# Patient Record
Sex: Male | Born: 1975 | Race: White | Hispanic: No | Marital: Single | Smoking: Current every day smoker
Health system: Southern US, Community
[De-identification: ages and names within clinical notes are randomized; demographics above are authoritative.]

## PROBLEM LIST (undated history)

## (undated) DIAGNOSIS — E119 Type 2 diabetes mellitus without complications: Secondary | ICD-10-CM

## (undated) DIAGNOSIS — J45909 Unspecified asthma, uncomplicated: Secondary | ICD-10-CM

## (undated) HISTORY — PX: KNEE SURGERY: SHX244

## (undated) HISTORY — DX: Unspecified asthma, uncomplicated: J45.909

## (undated) HISTORY — DX: Type 2 diabetes mellitus without complications: E11.9

---

## 1998-01-12 ENCOUNTER — Ambulatory Visit (HOSPITAL_COMMUNITY): Admission: RE | Admit: 1998-01-12 | Discharge: 1998-01-12 | Payer: Self-pay | Admitting: Internal Medicine

## 2010-06-05 ENCOUNTER — Ambulatory Visit (HOSPITAL_COMMUNITY): Admission: RE | Admit: 2010-06-05 | Discharge: 2010-06-05 | Payer: Self-pay | Admitting: Family Medicine

## 2017-01-06 ENCOUNTER — Ambulatory Visit (INDEPENDENT_AMBULATORY_CARE_PROVIDER_SITE_OTHER): Payer: 59 | Admitting: Internal Medicine

## 2017-01-06 ENCOUNTER — Encounter: Payer: Self-pay | Admitting: Internal Medicine

## 2017-01-06 VITALS — BP 122/80 | HR 103 | Ht 67.0 in | Wt 128.0 lb

## 2017-01-06 DIAGNOSIS — R05 Cough: Secondary | ICD-10-CM

## 2017-01-06 DIAGNOSIS — F17210 Nicotine dependence, cigarettes, uncomplicated: Secondary | ICD-10-CM

## 2017-01-06 DIAGNOSIS — R058 Other specified cough: Secondary | ICD-10-CM

## 2017-01-06 MED ORDER — FAMOTIDINE 20 MG PO TABS: ORAL_TABLET | ORAL | 2 refills | Status: DC

## 2017-01-06 MED ORDER — PREDNISONE 10 MG PO TABS: ORAL_TABLET | ORAL | 0 refills | Status: DC

## 2017-01-06 MED ORDER — TRAMADOL HCL 50 MG PO TABS: ORAL_TABLET | ORAL | 0 refills | Status: DC

## 2017-01-06 MED ORDER — PANTOPRAZOLE SODIUM 40 MG PO TBEC: 40.0000 mg | DELAYED_RELEASE_TABLET | Freq: Every day | ORAL | 2 refills | Status: DC

## 2017-01-06 NOTE — Assessment & Plan Note (Addendum)
Spirometry 01/06/2017  No obstruction/ no curvature on f/v loop   > 3 min discussion I reviewed the Fletcher curve with the patient that basically indicates  if you quit smoking when your best day FEV1 is still well preserved (as is clearly  the case here)  it is highly unlikely you will progress to severe disease and informed the patient there was  no medication on the market that has proven to alter the curve/ its downward trajectory  or the likelihood of progression of their disease(unlike other chronic medical conditions such as atheroclerosis where we do think we can change the natural hx with risk reducing meds)    Therefore stopping smoking and maintaining abstinence is the most important aspect of care, not choice of inhalers or for that matter, doctors.

## 2017-01-06 NOTE — Assessment & Plan Note (Addendum)
Spirometry 01/06/2017  No obstruction/ no curvature on f/v loop  Sinus CT 01/06/2017 >>>   The most common causes of chronic cough in immunocompetent adults include the following: upper airway cough syndrome (UACS), previously referred to as postnasal drip syndrome (PNDS), which is caused by variety of rhinosinus conditions; (2) asthma; (3) GERD; (4) chronic bronchitis from cigarette smoking or other inhaled environmental irritants; (5) nonasthmatic eosinophilic bronchitis; and (6) bronchiectasis.   These conditions, singly or in combination, have accounted for up to 94% of the causes of chronic cough in prospective studies.   Other conditions have constituted no >6% of the causes in prospective studies These have included bronchogenic carcinoma, chronic interstitial pneumonia, sarcoidosis, left ventricular failure, ACEI-induced cough, and aspiration from a condition associated with pharyngeal dysfunction.    Chronic cough is often simultaneously caused by more than one condition. A single cause has been found from 38 to 82% of the time, multiple causes from 18 to 62%. Multiply caused cough has been the result of three diseases up to 42% of the time and I strongly suspect this is the case here.  However predominant symptom of "something stuck in the throat" are typical of Upper airway cough syndrome (previously labeled PNDS) , is  so named because it's frequently impossible to sort out how much is  CR/sinusitis with freq throat clearing (which can be related to primary GERD)   vs  causing  secondary (" extra esophageal")  GERD from wide swings in gastric pressure that occur with throat clearing, often  promoting self use of mint and menthol lozenges that reduce the lower esophageal sphincter tone and exacerbate the problem further in a cyclical fashion.   These are the same pts (now being labeled as having "irritable larynx syndrome" by some cough centers) who not infrequently have a history of having  failed to tolerate ace inhibitors,  dry powder inhalers or biphosphonates or report having atypical/extraesophageal reflux symptoms that don't respond to standard doses of PPI  and are easily confused as having aecopd or asthma flares by even experienced allergists/ pulmonologists (myself included).   Of the three most common causes of  Sub-acute or recurrent or chronic cough, only one (GERD)  can actually contribute to/ trigger  the other two (asthma and post nasal drip syndrome)  and perpetuate the cylce of cough.  While not intuitively obvious, many patients with chronic low grade reflux do not cough until there is a primary insult that disturbs the protective epithelial barrier and exposes sensitive nerve endings.   This is typically viral (which was likely the case here) but can be direct physical injury such as with an endotracheal tube.   The point is that once this occurs, it is difficult to eliminate the cycle  using anything but a maximally effective acid suppression regimen at least in the short run, accompanied by an appropriate diet to address non acid GERD and control / eliminate the cough itself for at least 3 days.    rec max rx for gerd/ cyclical cough and upper airways inflammation with 24 acid suppression/ tramadol to eliminate throat clearing and Prednisone 10 mg take  4 each am x 2 days,   2 each am x 2 days,  1 each am x 2 days and stop then return prn   Total time devoted to counseling  > 50 % of initial 60 min office visit:  review case with pt/ discussion of options/alternatives/ personally creating written customized instructions  in presence of  pt  then going over those specific  Instructions directly with the pt including how to use all of the meds but in particular covering each new medication in detail and the difference between the maintenance= "automatic" meds and the prns using an action plan format for the latter (If this problem/symptom => do that organization reading  Left to right).  Please see AVS from this visit for a full list of these instructions which I personally wrote for this pt and  are unique to this visit.

## 2017-01-06 NOTE — Progress Notes (Signed)
Subjective:     Patient ID: Adam Potter, male   DOB: 02-18-1976, 41 y.o.   MRN: [id number]  HPI  [age] yowm active smoker referred to pulmonary clinic 01/06/2017 by Jarrett Sohoourtney Wharton Pa with new onset cough x late Nov 2017 minimally improved on  ventolin / asthmanex with sensation of "something stuck" in his throat.   01/06/2017 1st [facility] office visit/ Wert   Chief Complaint  Patient presents with  . Pulmonary Consult    Referred by Jarrett Sohoourtney Wharton, PA.  Pt c/o SOB, cough and the feeling of congestion in his throat since Thanksgiving 2017.  His cough is occ prod with white sputum.  He states he notices the SOB late in the evening and first thing in the am.    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  Not limited by breathing from desired activities    No obvious day to day or daytime variability or assoc excess/ purulent sputum or mucus plugs or hemoptysis or cp or chest tightness, subjective wheeze or overt   hb symptoms. No unusual exp hx or h/o childhood pna/ asthma or knowledge of premature birth.  Sleeping ok without nocturnal  or early am exacerbation  of respiratory  c/o's or need for noct saba. Also denies any obvious fluctuation of symptoms with weather or environmental changes or other aggravating or alleviating factors except as outlined above   Current Medications, Allergies, Complete Past Medical History, Past Surgical History, Family History, and Social History were reviewed in [facility] electronic medical record.  ROS  The following are not active complaints unless bolded sore throat, dysphagia, dental problems, itching, sneezing,   nasal congestion or excess/ purulent secretions, ear ache,   fever, chills, sweats, unintended wt loss, classically pleuritic or exertional cp,  orthopnea pnd or leg swelling, presyncope, palpitations, abdominal pain, anorexia, nausea, vomiting, diarrhea  or change in bowel or bladder habits, change in stools or urine, dysuria,hematuria,  rash, arthralgias, visual complaints, headache, numbness, weakness or ataxia or problems with walking or coordination,  change in mood/affect or memory.         Review of Systems     Objective:   Physical Exam    amb wm nad    Wt Readings from Last 3 Encounters:  01/06/17 128 lb (58.1 kg)    Vital signs reviewed - Note on arrival 02 sats  98% on RA      HEENT: nl dentition, turbinates bilaterally, and oropharynx. Nl external ear canals without cough reflex   NECK :  without JVD/Nodes/TM/ nl carotid upstrokes bilaterally   LUNGS: no acc muscle use,  Nl contour chest which is clear to A and P bilaterally without cough on insp or exp maneuvers   CV:  RRR  no s3 or murmur or increase in P2, and no edema   ABD:  soft and nontender with nl inspiratory excursion in the supine position. No bruits or organomegaly appreciated, bowel sounds nl  MS:  Nl gait/ ext warm without deformities, calf tenderness, cyanosis or clubbing No obvious joint restrictions   SKIN: warm and dry without lesions    NEURO:  alert, approp, nl sensorium with  no motor or cerebellar deficits apparent.     I personally reviewed images and agree with radiology impression as follows:  CXR:   10/02/16  wnl    Assessment:

## 2017-01-06 NOTE — Patient Instructions (Signed)
The key is to stop smoking completely before smoking completely stops you - it's clearly not too late  Prednisone 10 mg take  4 each am x 2 days,   2 each am x 2 days,  1 each am x 2 days and stop   Pantoprazole (protonix) 40 mg   Take  30-60 min before first meal of the day and Pepcid (famotidine)  20 mg one @  bedtime until return to office - this is the best way to tell whether stomach acid is contributing to your problem.     GERD (REFLUX)  is an extremely common cause of respiratory symptoms just like yours , many times with no obvious heartburn at all.    It can be treated with medication, but also with lifestyle changes including elevation of the head of your bed (ideally with 6 inch  bed blocks),  Smoking cessation, avoidance of late meals, excessive alcohol, and avoid fatty foods, chocolate, peppermint, colas, red wine, and acidic juices such as orange juice.  NO MINT OR MENTHOL PRODUCTS SO NO COUGH DROPS   USE SUGARLESS CANDY INSTEAD (Jolley ranchers or Stover's or Life Savers) or even ice chips will also do - the key is to swallow to prevent all throat clearing. NO OIL BASED VITAMINS - use powdered substitutes.   Please see patient coordinator before you leave today  to schedule Sinus CT   If not better return here in 2 weeks

## 2017-01-08 ENCOUNTER — Ambulatory Visit (INDEPENDENT_AMBULATORY_CARE_PROVIDER_SITE_OTHER)
Admission: RE | Admit: 2017-01-08 | Discharge: 2017-01-08 | Disposition: A | Discharge status: Home / Self Care | Payer: 59 | Source: Ambulatory Visit | Attending: Internal Medicine | Admitting: Internal Medicine

## 2017-01-08 DIAGNOSIS — R05 Cough: Secondary | ICD-10-CM

## 2017-01-08 DIAGNOSIS — R058 Other specified cough: Secondary | ICD-10-CM

## 2017-01-08 NOTE — Progress Notes (Signed)
Spoke with pt and notified of results per Dr. Wert. Pt verbalized understanding and denied any questions. 

## 2017-01-30 ENCOUNTER — Encounter: Payer: Self-pay | Admitting: Internal Medicine

## 2017-01-30 ENCOUNTER — Ambulatory Visit (INDEPENDENT_AMBULATORY_CARE_PROVIDER_SITE_OTHER): Payer: 59 | Admitting: Internal Medicine

## 2017-01-30 VITALS — BP 126/80 | HR 95 | Ht 67.0 in | Wt 129.0 lb

## 2017-01-30 DIAGNOSIS — R05 Cough: Secondary | ICD-10-CM | POA: Diagnosis not present

## 2017-01-30 DIAGNOSIS — R058 Other specified cough: Secondary | ICD-10-CM

## 2017-01-30 DIAGNOSIS — F17210 Nicotine dependence, cigarettes, uncomplicated: Secondary | ICD-10-CM

## 2017-01-30 NOTE — Progress Notes (Signed)
Subjective:     Patient ID: Adam Potter, male   DOB: 26-May-1976,    MRN: [id number]   Brief patient profile:  85 yowm active smoker referred to pulmonary clinic 01/06/2017 by Jarrett Soho Pa with new onset cough x late Nov 2017 minimally improved on  ventolin / asthmanex with sensation of "something stuck" in his throat    History of Present Illness  01/06/2017 1st [facility] office visit/ Maripat Borba   Chief Complaint  Patient presents with  . Pulmonary Consult    Referred by Jarrett Soho, PA.  Pt c/o SOB, cough and the feeling of congestion in his throat since Thanksgiving 2017.  His cough is occ prod with white sputum.  He states he notices the SOB late in the evening and first thing in the am.    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 rec The key is to stop smoking completely before smoking completely stops you - it's clearly not too late Prednisone 10 mg take  4 each am x 2 days,   2 each am x 2 days,  1 each am x 2 days and stop  Pantoprazole (protonix) 40 mg   Take  30-60 min before first meal of the day and Pepcid (famotidine)  20 mg one @  bedtime until return to office - this is the best way to tell whether stomach acid is contributing to your problem.   GERD (REFLUX)    01/30/2017  f/u ov/Luellen Howson re:  uacs  Improved on gerd rx / still smoking / no resp rx  Chief Complaint  Patient presents with  . Follow-up    Breathing and cough have improved some but not back to normal baseline. No new co's.    has not used any saba/ really Not limited by breathing from desired activities  But not aerobically active  No obvious day to day or daytime  variability or assoc excess/ purulent sputum or mucus plugs or hemoptysis or cp or chest tightness, subjective wheeze or overt sinus or hb symptoms. No unusual exp hx or h/o childhood pna/ asthma or knowledge of premature birth.  Sleeping ok without nocturnal  or early am exacerbation  of respiratory  c/o's or need for noct saba. Also denies any obvious fluctuation of symptoms with weather or environmental changes or other aggravating or alleviating factors except as outlined above   Current Medications, Allergies, Complete Past Medical History, Past Surgical History, Family History, and Social History were reviewed in [facility] record.  ROS  The following are not active complaints unless bolded sore throat, dysphagia, dental problems, itching, sneezing,  nasal congestion or excess/ purulent secretions, ear ache,   fever, chills, sweats, unintended wt loss, classically pleuritic or exertional cp,  orthopnea pnd or leg swelling, presyncope, palpitations, abdominal pain, anorexia, nausea, vomiting, diarrhea  or change in bowel or bladder habits, change in stools or urine, dysuria,hematuria,  rash, arthralgias, visual complaints, headache, numbness, weakness or ataxia or problems with walking or coordination,  change in mood/affect or memory.  Objective:   Physical Exam    amb wm nad     01/30/2017       129   01/06/17 128 lb (58.1 kg)    Vital signs reviewed - Note on arrival 02 sats  100% on RA      HEENT: nl dentition, turbinates bilaterally, and oropharynx. Nl external ear canals without cough reflex   NECK :  without JVD/Nodes/TM/ nl carotid upstrokes bilaterally   LUNGS: no acc muscle use,  Nl contour chest which is completely clear to A and P bilaterally without cough on insp or exp maneuvers   CV:  RRR  no s3 or murmur or increase in P2, and no edema   ABD:  soft and nontender with nl inspiratory excursion in the supine position. No  bruits or organomegaly appreciated, bowel sounds nl  MS:  Nl gait/ ext warm without deformities, calf tenderness, cyanosis or clubbing No obvious joint restrictions   SKIN: warm and dry without lesions    NEURO:  alert, approp, nl sensorium with  no motor or cerebellar deficits apparent.       I personally reviewed images and agree with radiology impression as follows:  CXR:   10/02/16 wnl    Assessment:       Outpatient Encounter Prescriptions as of 01/30/2017  Medication Sig  . albuterol (VENTOLIN HFA) 108 (90 Base) MCG/ACT inhaler Inhale 2 puffs into the lungs every 6 (six) hours as needed for wheezing or shortness of breath.  . dextromethorphan (DELSYM) 30 MG/5ML liquid 1 dose every 12 hours as needed  . famotidine (PEPCID) 20 MG tablet One at bedtime  . ibuprofen (ADVIL,MOTRIN) 200 MG tablet Take 200 mg by mouth every 6 (six) hours as needed.  . metFORMIN (GLUCOPHAGE-XR) 500 MG 24 hr tablet Take 500 mg by mouth 2 (two) times daily.  . pantoprazole (PROTONIX) 40 MG tablet Take 1 tablet (40 mg total) by mouth daily. Take 30-60 min before first meal of the day  . [DISCONTINUED] predniSONE (DELTASONE) 10 MG tablet Take  4 each am x 2 days,   2 each am x 2 days,  1 each am x 2 days and stop  . [DISCONTINUED] traMADol (ULTRAM) 50 MG tablet 1-2 every 4 hours as needed for cough or pain (Patient not taking: Reported on 01/30/2017)   No facility-administered encounter medications on file as of 01/30/2017.

## 2017-01-30 NOTE — Assessment & Plan Note (Signed)
Spirometry 01/06/2017  No obstruction/ no curvature on f/v loop  Sinus CT 01/08/2017 >>> Normal paranasal sinuses   Clearly improving s resp rx and on gerd rx typical of Upper airway cough syndrome (previously labeled PNDS) , is  so named because it's frequently impossible to sort out how much is  CR/sinusitis with freq throat clearing (which can be related to primary GERD)   vs  causing  secondary (" extra esophageal")  GERD from wide swings in gastric pressure that occur with throat clearing, often  promoting self use of mint and menthol lozenges that reduce the lower esophageal sphincter tone and exacerbate the problem further in a cyclical fashion.   These are the same pts (now being labeled as having "irritable larynx syndrome" by some cough centers) who not infrequently have a history of having failed to tolerate ace inhibitors,  dry powder inhalers or biphosphonates or report having atypical/extraesophageal reflux symptoms that don't respond to standard doses of PPI  and are easily confused as having aecopd or asthma flares by even experienced allergists/ pulmonologists (myself included).  rec stop smoking/ continue gerd rx at least 6 more weeks to get an idea re max benefit then try off and if flares needs gi eval next  Pulmonary f/u is prn

## 2017-01-30 NOTE — Assessment & Plan Note (Signed)
>   3 min discussion  I emphasized that although we never turn away smokers from the pulmonary clinic, we do ask that they understand that the recommendations that we make  won't work nearly as well in the presence of continued cigarette exposure.  Not willing to commit to quit at this point >  Follow up per Primary Care planned

## 2017-01-30 NOTE — Patient Instructions (Addendum)
Stay on same program x 6 weeks and return if not all better   Only use your albuterol as a rescue medication to be used if you can't catch your breath by resting or doing a relaxed purse lip breathing pattern.  - The less you use it, the better it will work when you need it. - Ok to use up to 2 puffs  every 4 hours if you must but call for immediate appointment if use goes up over your usual need - Don't leave home without it !!  (think of it like the spare tire for your car)   The key is to stop smoking completely before smoking completely stops you!    If you are satisfied with your treatment plan,  let your doctor know and he/she can either refill your medications or you can return here when your prescription runs out.     If in any way you are not 100% satisfied,  please tell us.  If 100% better, tell your friends!  Pulmonary follow up is as needed

## 2017-03-30 ENCOUNTER — Other Ambulatory Visit: Payer: Self-pay | Admitting: Internal Medicine

## 2017-03-30 DIAGNOSIS — R05 Cough: Secondary | ICD-10-CM

## 2017-03-30 DIAGNOSIS — R058 Other specified cough: Secondary | ICD-10-CM

## 2017-05-11 ENCOUNTER — Other Ambulatory Visit: Payer: Self-pay | Admitting: Internal Medicine

## 2017-05-11 DIAGNOSIS — R058 Other specified cough: Secondary | ICD-10-CM

## 2017-05-11 DIAGNOSIS — R05 Cough: Secondary | ICD-10-CM

## 2018-02-19 ENCOUNTER — Encounter: Payer: Self-pay | Admitting: Internal Medicine

## 2018-02-19 ENCOUNTER — Ambulatory Visit: Payer: 59 | Admitting: Internal Medicine

## 2018-02-19 ENCOUNTER — Ambulatory Visit (INDEPENDENT_AMBULATORY_CARE_PROVIDER_SITE_OTHER)
Admission: RE | Admit: 2018-02-19 | Discharge: 2018-02-19 | Disposition: A | Discharge status: Home / Self Care | Payer: 59 | Source: Ambulatory Visit | Attending: Internal Medicine | Admitting: Internal Medicine

## 2018-02-19 VITALS — BP 138/80 | HR 116 | Ht 67.0 in | Wt 130.0 lb

## 2018-02-19 DIAGNOSIS — F17210 Nicotine dependence, cigarettes, uncomplicated: Secondary | ICD-10-CM | POA: Diagnosis not present

## 2018-02-19 DIAGNOSIS — J45991 Cough variant asthma: Secondary | ICD-10-CM

## 2018-02-19 MED ORDER — MOMETASONE FURO-FORMOTEROL FUM 100-5 MCG/ACT IN AERO: 2.0000 | INHALATION_SPRAY | Freq: Two times a day (BID) | RESPIRATORY_TRACT | 0 refills | Status: DC

## 2018-02-19 MED ORDER — MOMETASONE FURO-FORMOTEROL FUM 100-5 MCG/ACT IN AERO: 2.0000 | INHALATION_SPRAY | Freq: Two times a day (BID) | RESPIRATORY_TRACT | 11 refills | Status: DC

## 2018-02-19 NOTE — Patient Instructions (Addendum)
Plan A = Automatic = dulera 100 Take 2 puffs first thing in am and then another 2 puffs about 12 hours later.   Work on inhaler technique:  relax and gently blow all the way out then take a nice smooth deep breath back in, triggering the inhaler at same time you start breathing in.  Hold for up to 5 seconds if you can. Blow out thru nose. Rinse and gargle with water when done      Plan B = Backup Only use your albuterol as a rescue medication to be used if you can't catch your breath by resting or doing a relaxed purse lip breathing pattern.  - The less you use it, the better it will work when you need it. - Ok to use the inhaler up to 2 puffs  every 4 hours if you must but call for appointment if use goes up over your usual need - Don't leave home without it !!  (think of it like the spare tire for your car)     Stop smoking before smoking stops you - clearly it's not too late and your cough should be the first thing you notice that gets better.  Please remember to go to the  x-ray department downstairs in the basement  for your tests - we will call you with the results when they are available.       Please schedule a follow up office visit in 6 weeks, call sooner if needed with all medications /inhalers/ solutions in hand so we can verify exactly what you are taking. This includes all medications from all doctors and over the counters

## 2018-02-19 NOTE — Progress Notes (Signed)
Subjective:    Patient ID: Adam Potter, male   DOB: 1976-06-29,    MRN: [id number]     Brief patient profile:  [age] yowm active smoker referred to pulmonary clinic 01/06/2017 by Jarrett Sohoourtney Wharton Pa with new onset cough x late Nov 2017 minimally improved on  ventolin / asmanex with sensation of "something stuck" in his throat    History of Present Illness  01/06/2017 1st [facility] office visit/ Adam Potter   Chief Complaint  Patient presents with  . Pulmonary Consult    Referred by Jarrett Sohoourtney Wharton, PA.  Pt c/o SOB, cough and the feeling of congestion in his throat since Thanksgiving 2017.  His cough is occ prod with white sputum.  He states he notices the SOB late in the evening and first thing in the am.    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 rec The key is to stop smoking completely before smoking completely stops you - it's clearly not too late Prednisone 10 mg take  4 each am x 2 days,   2 each am x 2 days,  1 each am x 2 days and stop  Pantoprazole (protonix) 40 mg   Take  30-60 min before first meal of the day and Pepcid (famotidine)  20 mg one @  bedtime until return to office - this is the best way to tell whether stomach acid is contributing to your problem.   GERD (REFLUX) diet    01/30/2017  f/u ov/Dean Goldner re:  uacs  Improved on gerd rx / still smoking / no resp rx  Chief Complaint  Patient presents with  . Follow-up    Breathing and cough have improved some but not back to normal baseline. No new co's.    has not used any saba/ really Not limited by breathing from desired activities  But not aerobically active rec Stay on same program x 6  weeks and return if not all better  Only use your albuterol as a rescue medication The key is to stop smoking completely before smoking completely stops you!    02/19/2018  Acute extended ov/Janaisha Tolsma  Worse sob/ cough/ wheeze still smoking  Chief Complaint  Patient presents with  . Acute Visit    Pt c/o wheezing and SOB x 1 month- worse at night. He also c/o CP x 3 days. He has an albuterol inhaler that he does not use.   had been asthmanex for about a year (though wasn't on med  list when he was last seen  01/30/17 and reporting doing better per hx but may have actually been on it that point, though stated it wasn't helping in original eval) and it was not renewed about a month prior to OV  Then worse since  stopped whereas had been off the gerd treatment for many months prior s flare of symptoms At hs notes cough/ wheeze /sob worse Does not think saba helps but hfa quite poor   No obvious day to day or daytime variability or assoc excess/ purulent sputum or mucus plugs or hemoptysis or cp or chest tightness, subjective wheeze or overt sinus or hb  symptoms.    Also denies any obvious fluctuation of symptoms with weather or environmental changes or other aggravating or alleviating factors except as outlined above   No unusual exposure hx or h/o childhood pna/ asthma or knowledge of premature birth.  Current Allergies, Complete Past Medical History, Past Surgical History, Family History, and Social History were reviewed in [employer] record.  ROS  The following are not active complaints unless bolded Hoarseness, sore throat, dysphagia, dental problems, itching, sneezing,  nasal congestion or discharge of excess mucus or purulent secretions, ear ache,   fever, chills, sweats, unintended wt loss or wt gain, classically pleuritic or exertional cp,  orthopnea pnd or arm/hand swelling  or leg swelling, presyncope, palpitations, abdominal pain, anorexia, nausea, vomiting, diarrhea   or change in bowel habits or change in bladder habits, change in stools or change in urine, dysuria, hematuria,  rash, arthralgias, visual complaints, headache, numbness, weakness or ataxia or problems with walking or coordination,  change in mood or  memory.        Current Meds - not sure this list is correct   Medication Sig  . albuterol (VENTOLIN HFA) 108 (90 Base) MCG/ACT inhaler Inhale 2 puffs into the lungs every 6 (six) hours as needed for wheezing or shortness of breath.  Marland Kitchen ibuprofen (ADVIL,MOTRIN) 200 MG tablet Take 200 mg by mouth every 6 (six) hours as needed.  . metFORMIN (GLUCOPHAGE-XR) 500 MG 24 hr tablet Take 500 mg by mouth 2 (two) times daily.                  Objective:   Physical Exam    amb wm nad / affect ? Baseline   02/19/2018        130   01/30/2017       129   01/06/17 128 lb (58.1 kg)     Vital signs reviewed - Note on arrival 02 sats  100% on RA      HEENT: nl dentition, turbinates bilaterally, and oropharynx. Nl external ear canals without cough reflex   NECK :  without JVD/Nodes/TM/ nl carotid upstrokes bilaterally   LUNGS: no acc muscle use,  Nl contour chest which is clear to A and P bilaterally without cough on insp or exp maneuvers   CV:  RRR  no s3 or murmur or increase in P2, and no edema   ABD:  soft and nontender with nl inspiratory excursion in the supine position. No bruits or organomegaly appreciated, bowel sounds nl  MS:  Nl gait/ ext warm without deformities, calf tenderness, cyanosis or clubbing No obvious joint restrictions   SKIN: warm and dry without lesions    NEURO:  alert, approp, nl sensorium with  no motor or cerebellar deficits apparent.      CXR PA and Lateral:   02/19/2018 :    I personally reviewed images and agree with radiology impression as follows:    No active cardiopulmonary disease.    Assessment:

## 2018-02-20 ENCOUNTER — Encounter: Payer: Self-pay | Admitting: Internal Medicine

## 2018-02-20 NOTE — Assessment & Plan Note (Signed)
4-5 min discussion re active cigarette smoking in addition to office E&M  Ask about tobacco use:   ongoing Advise quitting:   I reviewed the Fletcher curve with the patient that basically indicates  if you quit smoking when your best day FEV1 is still well preserved (as is clearly  the case here)  it is highly unlikely you will progress to severe disease and informed the patient there was  no medication on the market that has proven to alter the curve/ its downward trajectory  or the likelihood of progression of their disease(unlike other chronic medical conditions such as atheroclerosis where we do think we can change the natural hx with risk reducing meds)    Therefore stopping smoking and maintaining abstinence are  the most important aspects of his care, not choice of inhalers or for that matter, doctors.     Assess willingness:  Not committed at this point Assist in quit attempt:  Per PCP when ready Arrange follow up:   Follow up per Primary Care planned

## 2018-02-20 NOTE — Assessment & Plan Note (Signed)
Spirometry 01/06/2017  No obstruction/ no curvature on f/v loop  Sinus CT 01/08/2017 >>> Normal paranasal sinuses.  - Spirometry 02/19/2018  FEV1 2.65 (71%)  Ratio 75 p nothing with no curvature  - 02/19/2018  After extensive coaching inhaler device  effectiveness =    75% from a baseline of 25% > try dulera 100 2bid    DDX of  difficult airways management almost all start with A and  include Adherence, Ace Inhibitors, Acid Reflux, Active Sinus Disease, Alpha 1 Antitripsin deficiency, Anxiety masquerading as Airways dz,  ABPA,  Allergy(esp in young), Aspiration (esp in elderly), Adverse effects of meds,  Active smokers, A bunch of PE's (a small clot burden can't cause this syndrome unless there is already severe underlying pulm or vascular dz with poor reserve) plus two Bs  = Bronchiectasis and Beta blocker use..and one C= CHF    Adherence is always the initial "prime suspect" and is a multilayered concern that requires a "trust but verify" approach in every patient - starting with knowing how to use medications, especially inhalers, correctly, keeping up with refills and understanding the fundamental difference between maintenance and prns vs those medications only taken for a very short course and then stopped and not refilled.  - see hfa teaching  - return with all meds in hand using a trust but verify approach to confirm accurate Medication  Reconciliation The principal here is that until we are certain that the  patients are doing what we've asked, it makes no sense to ask them to do more.    Active smoking > see sep a/p   ? Allergy/ asthma component  >  Prev failed asmanex ? Strength but note wasn't inhaling it correctly anyway   ? Acid (or non-acid) GERD > always difficult to exclude as up to 75% of pts in some series report no assoc GI/ Heartburn symptoms>   diet restrictions reviewed > low threshold to add back acid suppression    ? Anxiety > usually at the bottom of this list of usual  suspects but should be much higher on this pt's based on H and P and  may interfere with ability to quit smoking and with adherence and also interpretation of response or lack thereof to symptom management which can be quite subjective.

## 2018-02-22 ENCOUNTER — Telehealth: Payer: Self-pay | Admitting: Internal Medicine

## 2018-02-22 NOTE — Telephone Encounter (Signed)
Nyoka CowdenWert, Michael B, MD sent to Christen Butteraskin, Siedah Sedor M, CMA        Call pt: Reviewed cxr and no acute change so [facility]    ATC, NA and no VM set up

## 2018-02-22 NOTE — Progress Notes (Signed)
ATC, NA and no option to leave msg 

## 2018-02-22 NOTE — Telephone Encounter (Signed)
Spoke with pt and notified of results per Dr. Wert. Pt verbalized understanding and denied any questions. 

## 2018-04-02 ENCOUNTER — Encounter: Payer: Self-pay | Admitting: Internal Medicine

## 2018-04-02 ENCOUNTER — Ambulatory Visit: Payer: Managed Care, Other (non HMO) | Admitting: Internal Medicine

## 2018-04-02 VITALS — BP 126/80 | HR 111 | Ht 66.0 in | Wt 126.0 lb

## 2018-04-02 DIAGNOSIS — J45991 Cough variant asthma: Secondary | ICD-10-CM

## 2018-04-02 MED ORDER — BUDESONIDE-FORMOTEROL FUMARATE 80-4.5 MCG/ACT IN AERO: 2.0000 | INHALATION_SPRAY | Freq: Two times a day (BID) | RESPIRATORY_TRACT | 0 refills | Status: DC

## 2018-04-02 MED ORDER — BUDESONIDE-FORMOTEROL FUMARATE 80-4.5 MCG/ACT IN AERO: 2.0000 | INHALATION_SPRAY | Freq: Two times a day (BID) | RESPIRATORY_TRACT | 12 refills | Status: DC

## 2018-04-02 NOTE — Progress Notes (Signed)
Subjective:    Patient ID: Adam Potter, male   DOB: 05/04/1976,    MRN: [id number]     Brief patient profile:  [age] yowm active smoker referred to pulmonary clinic 01/06/2017 by Jarrett Sohoourtney Wharton Pa with new onset cough x late Nov 2017 minimally improved on  ventolin / asmanex with sensation of "something stuck" in his throat    History of Present Illness  01/06/2017 1st [facility] office visit/ Lakeria Starkman   Chief Complaint  Patient presents with  . Pulmonary Consult    Referred by Jarrett Sohoourtney Wharton, PA.  Pt c/o SOB, cough and the feeling of congestion in his throat since Thanksgiving 2017.  His cough is occ prod with white sputum.  He states he notices the SOB late in the evening and first thing in the am.    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 rec The key is to stop smoking completely before smoking completely stops you - it's clearly not too late Prednisone 10 mg take  4 each am x 2 days,   2 each am x 2 days,  1 each am x 2 days and stop  Pantoprazole (protonix) 40 mg   Take  30-60 min before first meal of the day and Pepcid (famotidine)  20 mg one @  bedtime until return to office - this is the best way to tell whether stomach acid is contributing to your problem.   GERD (REFLUX) diet    01/30/2017  f/u ov/Chandler Swiderski re:  uacs  Improved on gerd rx / still smoking / no resp rx  Chief Complaint  Patient presents with  . Follow-up    Breathing and cough have improved some but not back to normal baseline. No new co's.    has not used any saba/ really Not limited by breathing from desired activities  But not aerobically active rec Stay on same program x 6  weeks and return if not all better  Only use your albuterol as a rescue medication The key is to stop smoking completely before smoking completely stops you!      02/19/2018  Acute extended ov/Demetry Bendickson  Worse sob/ cough/ wheeze still smoking  Chief Complaint  Patient presents with  . Acute Visit    Pt c/o wheezing and SOB x 1 month- worse at night. He also c/o CP x 3 days. He has an albuterol inhaler that he does not use.   had been asmanex for about a year (though wasn't on med  list when he was last seen  01/30/17 and reporting doing better per hx but may have actually been on it that point, though stated it wasn't helping in original eval) and it was not renewed about a month prior to OV  Then worse since  stopped whereas had been off the gerd treatment for many months prior s flare of symptoms At hs notes cough/ wheeze /sob worse Does not think saba helps but hfa quite poor  rec Plan A = Automatic = dulera 100 Take 2 puffs first thing in am and then another 2 puffs about 12 hours later.  Work on inhaler technique:  Plan B = Backup Only use your albuterol as a rescue medication  Stop smoking before smoking stops you - clearly it's not too late and your cough should be the first thing you notice that gets better. Bring meds     04/02/2018  f/u ov/Jaelyn Bourgoin re:  Asthma/ smoker maint now on dulera 100 1 bid  Chief Complaint  Patient presents with  . Follow-up    Breathing has improved and less wheezing. His CP has resolved. He has not had to use his albuterol.   Dyspnea:  Not limited by breathing from desired activities  No aerobics Cough: mostly in am's > minimal white mucus  Sleeping: ok now SABA use: none  02: none    No obvious day to day or daytime variability or assoc excess/ purulent sputum or mucus plugs or hemoptysis or cp or chest tightness, subjective wheeze or overt sinus or hb symptoms.   Sleeping flat  without nocturnal  or early am exacerbation  of respiratory  c/o's or  need for noct saba. Also denies any obvious fluctuation of symptoms with weather or environmental changes or other aggravating or alleviating factors except as outlined above   No unusual exposure hx or h/o childhood pna/ asthma or knowledge of premature birth.  Current Allergies, Complete Past Medical History, Past Surgical History, Family History, and Social History were reviewed in [employer] record.  ROS  The following are not active complaints unless bolded Hoarseness, sore throat, dysphagia, dental problems, itching, sneezing,  nasal congestion or discharge of excess mucus or purulent secretions, ear ache,   fever, chills, sweats, unintended wt loss or wt gain, classically pleuritic or exertional cp,  orthopnea pnd or arm/hand swelling  or leg swelling, presyncope, palpitations, abdominal pain, anorexia, nausea, vomiting, diarrhea  or change in bowel habits or change in bladder habits, change in stools or change in urine, dysuria, hematuria,  rash, arthralgias, visual complaints, headache, numbness, weakness or ataxia or problems with walking or coordination,  change in mood or  memory.        Current Meds  Medication Sig  . albuterol (VENTOLIN HFA) 108 (90 Base) MCG/ACT inhaler Inhale 2 puffs into the lungs every 6 (six) hours as needed for wheezing or shortness of breath.  Marland Kitchen ibuprofen (ADVIL,MOTRIN) 200 MG tablet Take 200 mg by mouth every 6 (six) hours as needed.  . metFORMIN (GLUCOPHAGE-XR) 500 MG 24 hr tablet Take 500 mg by mouth 2 (two) times daily.  . [  mometasone-formoterol (DULERA) 100-5 MCG/ACT AERO Inhale 2 puffs into the lungs 2 (two) times daily.                      Objective:   Physical Exam      04/02/2018         128  02/19/2018        130   01/30/2017       129   01/06/17 128 lb (58.1 kg)      amb wm nad  Vital signs reviewed - Note on arrival 02 sats  99% on RA   HEENT: nl dentition, turbinates bilaterally, and oropharynx. Nl  external ear canals without cough reflex   NECK :  without JVD/Nodes/TM/ nl carotid upstrokes bilaterally   LUNGS: no acc muscle use,  Nl contour chest which is clear to A and P bilaterally without cough on insp or exp maneuvers   CV:  RRR  no s3 or murmur or increase  in P2, and no edema   ABD:  soft and nontender with nl inspiratory excursion in the supine position. No bruits or organomegaly appreciated, bowel sounds nl  MS:  Nl gait/ ext warm without deformities, calf tenderness, cyanosis or clubbing No obvious joint restrictions   SKIN: warm and dry without lesions    NEURO:  alert, approp, nl sensorium with  no motor or cerebellar deficits apparent.              Assessment:

## 2018-04-02 NOTE — Patient Instructions (Addendum)
Plan A = Automatic = either dulera 100 or symb 80  Take 1-2 puffs first thing in am and then another 1-2 puffs about 12 hours later.    Plan B = Backup Only use your albuterol as a rescue medication to be used if you can't catch your breath by resting or doing a relaxed purse lip breathing pattern.  - The less you use it, the better it will work when you need it. - Ok to use the inhaler up to 2 puffs  every 4 hours if you must but call for appointment if use goes up over your usual need - Don't leave home without it !!  (think of it like the spare tire for your car)      Please schedule a follow up visit in 6  months but call sooner if needed

## 2018-04-05 ENCOUNTER — Encounter: Payer: Self-pay | Admitting: Internal Medicine

## 2018-04-05 NOTE — Assessment & Plan Note (Signed)
Spirometry 01/06/2017  No obstruction/ no curvature on f/v loop  Sinus CT 01/08/2017 >>> Normal paranasal sinuses. - Spirometry 02/19/2018  FEV1 2.65 (71%)  Ratio 75 p nothing with no curvature  - 02/19/2018    try dulera 100 2bid > changed to one twice daily around mid Aug 2019   - 04/02/2018  After extensive coaching inhaler device,  effectiveness =    90%   Despite active smoking, All goals of chronic asthma control met including optimal function and elimination of symptoms with minimal need for rescue therapy.  Contingencies discussed in full including contacting this office immediately if not controlling the symptoms using the rule of two's.      Advised re "prn" laba/ics"  Based on two studies from [place]; 20 p 1865 (2018) and 380 : p2020-30 (2019) in pts with mild asthma it is reasonable to use low dose symbicort eg 80 2bid "prn" flare in this setting but I emphasized this was only shown with symbicort and takes advantage of the rapid onset of action but is not the same as "rescue therapy" but can be stopped once the acute symptoms have resolved and the need for rescue has been minimized (< 2 x weekly)     I had an extended discussion with the patient reviewing all relevant studies completed to date and  lasting 15 to 20 minutes of a 25 minute visit    See device teaching which extended face to face time for this visit.  Each maintenance medication was reviewed in detail including emphasizing most importantly the difference between maintenance and prns and under what circumstances the prns are to be triggered using an action plan format that is not reflected in the computer generated alphabetically organized AVS which I have not found useful in most complex patients, especially with respiratory illnesses  Please see AVS for specific instructions unique to this visit that I personally wrote and verbalized to the the pt in detail and then reviewed with pt  by my nurse highlighting any  changes  in therapy recommended at today's visit to their plan of care.

## 2018-07-05 ENCOUNTER — Other Ambulatory Visit: Payer: Self-pay | Admitting: Internal Medicine

## 2018-07-05 MED ORDER — BUDESONIDE-FORMOTEROL FUMARATE 80-4.5 MCG/ACT IN AERO: 2.0000 | INHALATION_SPRAY | Freq: Two times a day (BID) | RESPIRATORY_TRACT | 11 refills | Status: AC

## 2019-02-25 ENCOUNTER — Other Ambulatory Visit: Payer: Self-pay

## 2019-02-25 DIAGNOSIS — R0989 Other specified symptoms and signs involving the circulatory and respiratory systems: Secondary | ICD-10-CM

## 2019-03-04 ENCOUNTER — Ambulatory Visit (HOSPITAL_COMMUNITY)
Admission: RE | Admit: 2019-03-04 | Discharge: 2019-03-04 | Disposition: A | Discharge status: Home / Self Care | Payer: Managed Care, Other (non HMO) | Source: Ambulatory Visit | Attending: Vascular Surgery | Admitting: Vascular Surgery

## 2019-03-04 ENCOUNTER — Other Ambulatory Visit: Payer: Self-pay

## 2019-03-04 ENCOUNTER — Ambulatory Visit: Payer: Managed Care, Other (non HMO) | Admitting: Vascular Surgery

## 2019-03-04 ENCOUNTER — Encounter: Payer: Self-pay | Admitting: Vascular Surgery

## 2019-03-04 VITALS — BP 124/86 | HR 90 | Temp 97.5°F | Resp 20 | Ht 66.0 in | Wt 124.8 lb

## 2019-03-04 DIAGNOSIS — R0989 Other specified symptoms and signs involving the circulatory and respiratory systems: Secondary | ICD-10-CM | POA: Diagnosis not present

## 2019-03-04 DIAGNOSIS — M25562 Pain in left knee: Secondary | ICD-10-CM

## 2019-03-04 NOTE — Progress Notes (Signed)
Patient ID: Adam Potter, male   DOB: 1976/01/03, 43 y.o.   MRN: [id number]  Reason for Consult: New Patient (Initial Visit)   Referred by [name], PA-C  Subjective:     HPI:  Adam Potter is a 43 y.o. male history of type 2 diabetes and is a current everyday smoker.  States that he has leg pain on the left.  This is mostly positional particularly when riding in a car.  Does not have any frank claudication symptoms.  Has never had any vascular issues.  Does not take blood thinners.  Does not have any tissue loss or ulceration.  Was found to have diminished pulses and was sent for further evaluation.  Past Medical History:  Diagnosis Date  . Asthma   . Diabetes mellitus without complication ([place])    No family history on file. Past Surgical History:  Procedure Laterality Date  . KNEE SURGERY Left     Short Social History:  Social History   Tobacco Use  . Smoking status: Current Every Day Smoker    Packs/day: 1.00    Years: 24.00    Pack years: 24.00    Types: Cigarettes  . Smokeless tobacco: Never Used  Substance Use Topics  . Alcohol use: Yes    Alcohol/week: 24.0 standard drinks    Types: 24 Cans of beer per week    No Known Allergies  Current Outpatient Medications  Medication Sig Dispense Refill  . albuterol (VENTOLIN HFA) 108 (90 Base) MCG/ACT inhaler Inhale 2 puffs into the lungs every 6 (six) hours as needed for wheezing or shortness of breath.    . budesonide-formoterol (SYMBICORT) 80-4.5 MCG/ACT inhaler Inhale 2 puffs into the lungs 2 (two) times daily. 1 Inhaler 11  . glimepiride (AMARYL) 4 MG tablet 1 TABLET WITH BREAKFAST OR THE FIRST MAIN MEAL OF THE DAY ONCE A DAY ORALLY 90 DAY(S)    . ibuprofen (ADVIL,MOTRIN) 200 MG tablet Take 200 mg by mouth every 6 (six) hours as needed.    . metFORMIN (GLUCOPHAGE-XR) 500 MG 24 hr tablet Take 500 mg by mouth 2 (two) times daily.  1   No current facility-administered medications for this visit.      Review of Systems  Constitutional:  Constitutional negative. HENT: HENT negative.  Eyes: Eyes negative.  Respiratory: Positive for wheezing.  Cardiovascular: Cardiovascular negative.  GI: Gastrointestinal negative.  Musculoskeletal: Musculoskeletal negative. Positive for leg pain.  Skin: Skin negative.  Neurological: Neurological negative. Hematologic: Hematologic/lymphatic negative.  Psychiatric: Psychiatric negative.        Objective:   Vitals:   03/04/19 0832  BP: 124/86  Pulse: 90  Resp: 20  Temp: (!) 97.5 F (36.4 C)  SpO2: 96%    Physical Exam Constitutional:      Appearance: Normal appearance.  HENT:     Head: Normocephalic.     Nose: Nose normal.     Mouth/Throat:     Mouth: Mucous membranes are moist.  Eyes:     Pupils: Pupils are equal, round, and reactive to light.  Neck:     Musculoskeletal: Normal range of motion and neck supple.  Cardiovascular:     Rate and Rhythm: Normal rate.     Pulses:          Popliteal pulses are 2+ on the right side and 2+ on the left side.       Dorsalis pedis pulses are 2+ on the right side and 2+ on the left side.  Pulmonary:     Effort: Pulmonary effort is normal.  Abdominal:     General: Abdomen is flat.     Palpations: Abdomen is soft.  Musculoskeletal: Normal range of motion.  Skin:    General: Skin is warm.     Capillary Refill: Capillary refill takes less than 2 seconds.  Neurological:     General: No focal deficit present.     Mental Status: He is alert.  Psychiatric:        Mood and Affect: Mood normal.        Behavior: Behavior normal.        Thought Content: Thought content normal.        Judgment: Judgment normal.     Data:  I have independently reviewed abi to be greater than 1 bilaterally     Assessment/Plan:     43 year old male with risk factors vascular disease ongoing smoking and diabetes type 2.  Does have palpable pulses bilaterally leg pain appears to be positional not related to any  vascular disease.  He will be high risk for vascular disease if he does not quit smoking and I discussed this with him today.  He does not want any medicine for smoking cessation.  He can follow-up on an as-needed basis.     Maeola HarmanBrandon Christopher Tlaloc Taddei MD [facility] [place]

## 2019-07-25 ENCOUNTER — Ambulatory Visit: Payer: Managed Care, Other (non HMO) | Attending: Internal Medicine

## 2019-07-25 DIAGNOSIS — Z20822 Contact with and (suspected) exposure to covid-19: Secondary | ICD-10-CM

## 2019-07-26 LAB — NOVEL CORONAVIRUS, NAA: SARS-CoV-2, NAA: NOT DETECTED

## 2019-10-26 ENCOUNTER — Ambulatory Visit (INDEPENDENT_AMBULATORY_CARE_PROVIDER_SITE_OTHER): Payer: Managed Care, Other (non HMO)

## 2019-10-26 ENCOUNTER — Ambulatory Visit (INDEPENDENT_AMBULATORY_CARE_PROVIDER_SITE_OTHER): Payer: Managed Care, Other (non HMO) | Admitting: Orthopedic Surgery

## 2019-10-26 ENCOUNTER — Other Ambulatory Visit: Payer: Self-pay

## 2019-10-26 DIAGNOSIS — M79605 Pain in left leg: Secondary | ICD-10-CM

## 2019-10-26 DIAGNOSIS — M659 Synovitis and tenosynovitis, unspecified: Secondary | ICD-10-CM

## 2019-10-26 DIAGNOSIS — M25522 Pain in left elbow: Secondary | ICD-10-CM

## 2019-10-28 ENCOUNTER — Encounter: Payer: Self-pay | Admitting: Orthopedic Surgery

## 2019-10-28 DIAGNOSIS — M659 Synovitis and tenosynovitis, unspecified: Secondary | ICD-10-CM

## 2019-10-28 DIAGNOSIS — M79605 Pain in left leg: Secondary | ICD-10-CM

## 2019-10-28 MED ORDER — BUPIVACAINE HCL 0.25 % IJ SOLN
4.0000 mL | INTRAMUSCULAR | Status: AC | PRN
  Administered 2019-10-28: 4 mL via INTRA_ARTICULAR

## 2019-10-28 MED ORDER — METHYLPREDNISOLONE ACETATE 40 MG/ML IJ SUSP
40.0000 mg | INTRAMUSCULAR | Status: AC | PRN
  Administered 2019-10-28: 40 mg via INTRA_ARTICULAR

## 2019-10-28 MED ORDER — LIDOCAINE HCL 1 % IJ SOLN
5.0000 mL | INTRAMUSCULAR | Status: AC | PRN
  Administered 2019-10-28: 5 mL

## 2019-10-28 NOTE — Progress Notes (Signed)
faxed

## 2019-10-28 NOTE — Progress Notes (Signed)
Office Visit Note   Patient: Adam Potter           Date of Birth: June 01, 1976           MRN: [id number] Visit Date: 10/26/2019 Requested by: Jarrett Soho, PA-C [address] [place],  [place] [address] PCP: Jarrett Soho, PA-C  Subjective: Chief Complaint  Patient presents with  . Left Leg - Pain  . Left Elbow - Pain    HPI: Adam Potter is a 44 y.o. male who presents to the office complaining of bilateral leg pain.  Patient notes worsening primarily left leg pain.  He denies any injury.  He localizes pain to the knee with radiation to the calf.  He is unable to sit for more than 45 minutes without severe pain.  He denies any groin pain or low back pain but does note numbness/tingling in the bilateral calves.  He has been taking ibuprofen with little relief.  He has a history of diabetes with his most recent A1c being 6.3.  He has a history of left knee arthroscopy 25 years ago but he cannot recall what this was for exactly.  Patient also complains of left elbow pain.  He injured his left elbow while playing golf years ago.  It subsided but over the last year he has developed recurrent pain.  He is right-hand dominant but golfs left-handed.  He has tried a counterforce brace with some relief.  He is able to still golf despite the pain..                ROS:  All systems reviewed are negative as they relate to the chief complaint within the history of present illness.  Patient denies fevers or chills.  Assessment & Plan: Visit Diagnoses:  1. Pain in left elbow   2. Pain in left leg     Plan: Patient is a 44 year old male who complains of primarily left leg pain as well left elbow pain.  Radiographs reviewed and do not reveal significant degenerative changes of the left knee.  Differential diagnosis includes occult left knee arthritis versus vascular claudication versus neurogenic claudication.  Pulses present but diminished in this patient.  Administered left knee  injection for both diagnostic and therapeutic purposes.  Patient will follow up with the office in 6 weeks for clinical recheck and we will decide whether or not to proceed with left knee MRI at that time.  Additionally regarding the left elbow, impression is tennis elbow and golfer's elbow concurrently.  Tennis elbow seems to be bothering him more relative to the golfers elbow.  Discussed options.  Patient will continue using the counterforce brace and will consider possibility of PRP injection in the future if the brace and stretching routine does not alleviate his pain.  Follow-up in 6 weeks.  No convincing symptoms of posterior interosseous nerve compression.  Follow-Up Instructions: No follow-ups on file.   Orders:  Orders Placed This Encounter  Procedures  . XR Lumbar Spine 2-3 Views  . XR Elbow 2 Views Left  . XR Tibia/Fibula Left   No orders of the defined types were placed in this encounter.     Procedures: Large Joint Inj: L knee on 10/28/2019 4:02 PM Indications: diagnostic evaluation, joint swelling and pain Details: 18 G 1.5 in needle, superolateral approach  Arthrogram: No  Medications: 5 mL lidocaine 1 %; 40 mg methylPREDNISolone acetate 40 MG/ML; 4 mL bupivacaine 0.25 % Outcome: tolerated well, no immediate complications Procedure, treatment  alternatives, risks and benefits explained, specific risks discussed. Consent was given by the patient. Immediately prior to procedure a time out was called to verify the correct patient, procedure, equipment, support staff and site/side marked as required. Patient was prepped and draped in the usual sterile fashion.       Clinical Data: No additional findings.  Objective: Vital Signs: There were no vitals taken for this visit.  Physical Exam:  Constitutional: Patient appears well-developed HEENT:  Head: Normocephalic Eyes:EOM are normal Neck: Normal range of motion Cardiovascular: Normal rate Pulmonary/chest: Effort  normal Neurologic: Patient is alert Skin: Skin is warm Psychiatric: Patient has normal mood and affect  Ortho Exam:  Left knee Exam Tender to palpation over the medial and lateral joint lines, more so over the medial. No unilateral swelling compared with the contralateral knee/calf.  Loss of body hair to the bilateral shins.  2+ DP pulse of the left lower extremity. No effusion Extensor mechanism intact No TTP over quad tendon, patellar tendon, pes anserinus, patella, tibial tubercle, LCL/MCL insertions Stable to varus/valgus stresses.  Stable to anterior/posterior drawer Extension to 0 degrees Flexion > 90 degrees  Left elbow exam No tenderness to palpation over the distal biceps.  Biceps tendon intact.  Most tender to palpation over the medial epicondyle and lateral epicondyle (lateral greater than medial).  Pain elicited at the lateral epicondyle with strong grip of the left hand.  No pain elicited in the elbow with resisted wrist extension, pronation, supination.  No tenderness to palpation over the posterior interosseous nerve site.  Specialty Comments:  No specialty comments available.  Imaging: No results found.   PMFS History: Patient Active Problem List   Diagnosis Date Noted  . Cough variant asthma 02/19/2018  . Upper airway cough syndrome 01/06/2017  . Cigarette smoker 01/06/2017   Past Medical History:  Diagnosis Date  . Asthma   . Diabetes mellitus without complication ([place])     No family history on file.  Past Surgical History:  Procedure Laterality Date  . KNEE SURGERY Left    Social History   Occupational History  . Not on file  Tobacco Use  . Smoking status: Current Every Day Smoker    Packs/day: 1.00    Years: 24.00    Pack years: 24.00    Types: Cigarettes  . Smokeless tobacco: Never Used  Substance and Sexual Activity  . Alcohol use: Yes    Alcohol/week: 24.0 standard drinks    Types: 24 Cans of beer per week  . Drug use: Never  . Sexual  activity: Not on file

## 2019-11-23 ENCOUNTER — Other Ambulatory Visit: Payer: Self-pay

## 2019-11-23 ENCOUNTER — Ambulatory Visit (INDEPENDENT_AMBULATORY_CARE_PROVIDER_SITE_OTHER): Payer: Managed Care, Other (non HMO) | Admitting: Orthopedic Surgery

## 2019-11-23 DIAGNOSIS — M659 Synovitis and tenosynovitis, unspecified: Secondary | ICD-10-CM | POA: Diagnosis not present

## 2019-11-23 DIAGNOSIS — M25562 Pain in left knee: Secondary | ICD-10-CM | POA: Diagnosis not present

## 2019-11-25 NOTE — Progress Notes (Signed)
Office Visit Note   Patient: Adam Potter           Date of Birth: Jan 13, 1976           MRN: [id number] Visit Date: 11/23/2019 Requested by: [name], PA-C [place],  [place] [address] PCP: [name], PA-C  Subjective: Chief Complaint  Patient presents with  . Left Elbow - Follow-up    HPI: Adam Potter is a 44 y.o. male who presents to the office complaining of left knee pain.  Patient returns to the office for 4-week follow-up.  He notes that he is doing about the same compared with how he was feeling 4 weeks ago.  He did have several days of relief from the injection but his pain has now returned.  He does report that while he had relief from his knee pain, he also noticed that he had relief from his calf pain as well.  He is taking ibuprofen as needed for pain.  He does have a history of diabetes.  He smokes 1-1/2 packs/day for couple decades.  He controls diabetes with Metformin as well as other medications he cannot remember..                ROS:  All systems reviewed are negative as they relate to the chief complaint within the history of present illness.  Patient denies fevers or chills.  Assessment & Plan: Visit Diagnoses:  1. Synovitis of left knee   2. Left knee pain, unspecified chronicity     Plan: Patient is a 44 year old male who presents complaining of left knee pain.  He had significant relief of symptoms for several days with the injection, including relief of anterior medial calf pain.  Impression is that his calf pain is referred pain from what ever pathology is causing his knee pain.  He does have a good pulse on exam today.  Ordered MRI of the left knee for further evaluation.  Patient will follow-up after MRI to review results.  No calf tenderness today and negative Homans.  No calf swelling.  Likely intra-articular pathology in the knee is present.  Follow-Up Instructions: No follow-ups on file.   Orders:  Orders Placed  This Encounter  Procedures  . MR Knee Left w/o contrast   No orders of the defined types were placed in this encounter.     Procedures: No procedures performed   Clinical Data: No additional findings.  Objective: Vital Signs: There were no vitals taken for this visit.  Physical Exam:  Constitutional: Patient appears well-developed HEENT:  Head: Normocephalic Eyes:EOM are normal Neck: Normal range of motion Cardiovascular: Normal rate Pulmonary/chest: Effort normal Neurologic: Patient is alert Skin: Skin is warm Psychiatric: Patient has normal mood and affect  Ortho Exam:  Left knee Exam Tender to palpation over the medial and lateral joint lines, more so over the medial joint line.  2+ DP pulse of the left lower extremity No effusion Extensor mechanism intact No TTP over the quad tendon, patellar tendon, pes anserinus, patella, tibial tubercle, LCL/MCL insertions Stable to varus/valgus stresses.  Stable to anterior/posterior drawer Extension to 0 degrees Flexion > 90 degrees  Specialty Comments:  No specialty comments available.  Imaging: No results found.   PMFS History: Patient Active Problem List   Diagnosis Date Noted  . Cough variant asthma 02/19/2018  . Upper airway cough syndrome 01/06/2017  . Cigarette smoker 01/06/2017   Past Medical History:  Diagnosis Date  . Asthma   .  Diabetes mellitus without complication ([facility])     No family history on file.  Past Surgical History:  Procedure Laterality Date  . KNEE SURGERY Left    Social History   Occupational History  . Not on file  Tobacco Use  . Smoking status: Current Every Day Smoker    Packs/day: 1.00    Years: 24.00    Pack years: 24.00    Types: Cigarettes  . Smokeless tobacco: Never Used  Substance and Sexual Activity  . Alcohol use: Yes    Alcohol/week: 24.0 standard drinks    Types: 24 Cans of beer per week  . Drug use: Never  . Sexual activity: Not on file

## 2019-11-26 ENCOUNTER — Encounter: Payer: Self-pay | Admitting: Orthopedic Surgery

## 2019-12-27 ENCOUNTER — Ambulatory Visit
Admission: RE | Admit: 2019-12-27 | Discharge: 2019-12-27 | Disposition: A | Discharge status: Home / Self Care | Payer: Managed Care, Other (non HMO) | Source: Ambulatory Visit | Attending: Orthopedic Surgery | Admitting: Orthopedic Surgery

## 2019-12-27 ENCOUNTER — Other Ambulatory Visit: Payer: Self-pay

## 2019-12-27 DIAGNOSIS — M25562 Pain in left knee: Secondary | ICD-10-CM

## 2020-01-04 ENCOUNTER — Encounter: Payer: Self-pay | Admitting: Orthopedic Surgery

## 2020-01-04 ENCOUNTER — Other Ambulatory Visit: Payer: Self-pay

## 2020-01-04 ENCOUNTER — Ambulatory Visit (INDEPENDENT_AMBULATORY_CARE_PROVIDER_SITE_OTHER): Payer: Managed Care, Other (non HMO) | Admitting: Orthopedic Surgery

## 2020-01-04 DIAGNOSIS — M23007 Cystic meniscus, unspecified meniscus, left knee: Secondary | ICD-10-CM | POA: Diagnosis not present

## 2020-01-04 DIAGNOSIS — S83282D Other tear of lateral meniscus, current injury, left knee, subsequent encounter: Secondary | ICD-10-CM | POA: Diagnosis not present

## 2020-01-04 DIAGNOSIS — S83249A Other tear of medial meniscus, current injury, unspecified knee, initial encounter: Secondary | ICD-10-CM

## 2020-01-07 ENCOUNTER — Encounter: Payer: Self-pay | Admitting: Orthopedic Surgery

## 2020-01-07 NOTE — Progress Notes (Signed)
Office Visit Note   Patient: Adam Potter           Date of Birth: 21-May-1976           MRN: [id number] Visit Date: 01/04/2020 Requested by: Jarrett Soho, PA-C [address] [place],  [place] [address] PCP: Jarrett Soho, PA-C  Subjective: Chief Complaint  Patient presents with  . Follow-up    HPI: Adam Potter is a 44 y.o. male who presents to the office complaining of left knee pain.  He returns to discuss MRI results of left knee.  MRI revealed large oblique tear of the posterior horn/body junction of the medial meniscus with a large para meniscal cyst along the periphery as well as a radial tear of the anterior horn of the lateral meniscus.  Also has partial-thickness cartilage loss of the medial lateral compartments with full-thickness cartilage fissure involving the patellar apex with subchondral marrow edema.  He notes that he is still having good relief from his prior injection.  He denies any mechanical symptoms and denies any lateral pain.  All of his pain is medial.  He denies any instability.  He is taking Advil to help with his pain.  He denies any numbness or tingling, groin pain, low back pain.  He works as a Financial risk analyst and states that he wants to avoid missing any time at work if possible.  He does have a history of diabetes and his last A1c was 6.3.Marland Kitchen                ROS:  All systems reviewed are negative as they relate to the chief complaint within the history of present illness.  Patient denies fevers or chills.  Assessment & Plan: Visit Diagnoses:  1. Acute medial meniscal tear, initial encounter   2. Acute lateral meniscus tear of left knee, subsequent encounter   3. Meniscal cyst, left     Plan:  Patient is a 44 year old male presents complaining of left knee pain.  He returns to discuss MRI of the left knee results.  Results are as described above in HPI.  He is having all medial sided pain.  After discussion of potential treatments and what surgical  management would entail, patient wants to hold off on surgery until he is more symptomatic.  It is difficult for him to miss work.  He has tenderness to palpation over the pes anserinus as well as the medial joint line, more so over the medial joint line.  No tenderness to palpation over the lateral joint line.  He does have an effusion.  Plan for patient to follow-up in 8 weeks for clinical recheck with likely aspiration/injection of the knee and the medial meniscal cyst at that time.  Patient agreed with this plan.  Surgery is indicated at this time based on the morphology of the meniscal tear; however, patient does not want to pursue that at this time.  We do need to check him in 8 weeks for recurrent knee effusion and progression of mechanical symptoms.  Follow-Up Instructions: No follow-ups on file.   Orders:  No orders of the defined types were placed in this encounter.  No orders of the defined types were placed in this encounter.     Procedures: No procedures performed   Clinical Data: No additional findings.  Objective: Vital Signs: There were no vitals taken for this visit.  Physical Exam:  Constitutional: Patient appears well-developed HEENT:  Head: Normocephalic Eyes:EOM are normal Neck: Normal range  of motion Cardiovascular: Normal rate Pulmonary/chest: Effort normal Neurologic: Patient is alert Skin: Skin is warm Psychiatric: Patient has normal mood and affect  Ortho Exam:  Left knee Exam Positive effusion Extensor mechanism intact Tender to palpation over the medial joint line as well as the pes anserinus No TTP over the  lateral jointlines, quad tendon, patellar tendon, patella, tibial tubercle, LCL/MCL insertions Stable to varus/valgus stresses.  Stable to anterior/posterior drawer Extension to 0 degrees Flexion > 90 degrees  Specialty Comments:  No specialty comments available.  Imaging: No results found.   PMFS History: Patient Active Problem List    Diagnosis Date Noted  . Cough variant asthma 02/19/2018  . Upper airway cough syndrome 01/06/2017  . Cigarette smoker 01/06/2017   Past Medical History:  Diagnosis Date  . Asthma   . Diabetes mellitus without complication ([place])     History reviewed. No pertinent family history.  Past Surgical History:  Procedure Laterality Date  . KNEE SURGERY Left    Social History   Occupational History  . Not on file  Tobacco Use  . Smoking status: Current Every Day Smoker    Packs/day: 1.00    Years: 24.00    Pack years: 24.00    Types: Cigarettes  . Smokeless tobacco: Never Used  Substance and Sexual Activity  . Alcohol use: Yes    Alcohol/week: 24.0 standard drinks    Types: 24 Cans of beer per week  . Drug use: Never  . Sexual activity: Not on file

## 2020-02-15 ENCOUNTER — Ambulatory Visit (INDEPENDENT_AMBULATORY_CARE_PROVIDER_SITE_OTHER): Payer: Managed Care, Other (non HMO) | Admitting: Orthopedic Surgery

## 2020-02-15 DIAGNOSIS — M659 Synovitis and tenosynovitis, unspecified: Secondary | ICD-10-CM

## 2020-02-18 ENCOUNTER — Encounter: Payer: Self-pay | Admitting: Orthopedic Surgery

## 2020-02-18 DIAGNOSIS — M25562 Pain in left knee: Secondary | ICD-10-CM | POA: Diagnosis not present

## 2020-02-18 DIAGNOSIS — M659 Synovitis and tenosynovitis, unspecified: Secondary | ICD-10-CM | POA: Diagnosis not present

## 2020-02-18 MED ORDER — BUPIVACAINE HCL 0.25 % IJ SOLN
4.0000 mL | INTRAMUSCULAR | Status: AC | PRN
  Administered 2020-02-18: 4 mL via INTRA_ARTICULAR

## 2020-02-18 MED ORDER — METHYLPREDNISOLONE ACETATE 40 MG/ML IJ SUSP
40.0000 mg | INTRAMUSCULAR | Status: AC | PRN
  Administered 2020-02-18: 40 mg via INTRA_ARTICULAR

## 2020-02-18 MED ORDER — LIDOCAINE HCL 1 % IJ SOLN
5.0000 mL | INTRAMUSCULAR | Status: AC | PRN
  Administered 2020-02-18: 5 mL

## 2020-02-18 NOTE — Progress Notes (Signed)
Office Visit Note   Patient: Adam Potter           Date of Birth: 1976/04/27           MRN: [id number] Visit Date: 02/15/2020 Requested by: Jarrett Soho, PA-C [address] [place],  [place] [address] PCP: Jarrett Soho, PA-C  Subjective: Chief Complaint  Patient presents with  . Left Knee - Pain    HPI: Brack is a patient with left knee pain.  He has no meniscal tear and meniscal cyst.  Wants to avoid surgery if possible.  Does report some mechanical symptoms.  He has been recommended to have arthroscopy and meniscal debridement.              ROS: All systems reviewed are negative as they relate to the chief complaint within the history of present illness.  Patient denies  fevers or chills.   Assessment & Plan: Visit Diagnoses:  1. Synovitis of left knee     Plan: Impression is medial meniscal tear and cyst left knee with patient requesting cortisone injection today.  We want to try to limit his cortisone injections to about 2/year.  Mild effusion today.  Knee is aspirated and injected and he needs to avoid a lot of loadbearing activities.  Still recommend arthroscopy and debridement for that meniscal tear and meniscal cyst.  He will consider his options and come back as needed.  Follow-Up Instructions: No follow-ups on file.   Orders:  No orders of the defined types were placed in this encounter.  No orders of the defined types were placed in this encounter.     Procedures: Large Joint Inj: L knee on 02/18/2020 10:20 PM Indications: diagnostic evaluation, joint swelling and pain Details: 18 G 1.5 in needle, superolateral approach  Arthrogram: No  Medications: 5 mL lidocaine 1 %; 40 mg methylPREDNISolone acetate 40 MG/ML; 4 mL bupivacaine 0.25 % Outcome: tolerated well, no immediate complications Procedure, treatment alternatives, risks and benefits explained, specific risks discussed. Consent was given by the patient. Immediately prior to procedure a  time out was called to verify the correct patient, procedure, equipment, support staff and site/side marked as required. Patient was prepped and draped in the usual sterile fashion.       Clinical Data: No additional findings.  Objective: Vital Signs: There were no vitals taken for this visit.  Physical Exam:   Constitutional: Patient appears well-developed HEENT:  Head: Normocephalic Eyes:EOM are normal Neck: Normal range of motion Cardiovascular: Normal rate Pulmonary/chest: Effort normal Neurologic: Patient is alert Skin: Skin is warm Psychiatric: Patient has normal mood and affect    Ortho Exam: Ortho exam demonstrates mild effusion left knee with stable collateral cruciate ligaments.  There is medial joint line tenderness.  Meniscal cyst barely palpable at the medial joint line posteriorly.  Range of motion is full.  No groin pain with internal extra rotation of the leg.  Specialty Comments:  No specialty comments available.  Imaging: No results found.   PMFS History: Patient Active Problem List   Diagnosis Date Noted  . Cough variant asthma 02/19/2018  . Upper airway cough syndrome 01/06/2017  . Cigarette smoker 01/06/2017   Past Medical History:  Diagnosis Date  . Asthma   . Diabetes mellitus without complication ([facility])     No family history on file.  Past Surgical History:  Procedure Laterality Date  . KNEE SURGERY Left    Social History   Occupational History  . Not on file  Tobacco Use  . Smoking status: Current Every Day Smoker    Packs/day: 1.00    Years: 24.00    Pack years: 24.00    Types: Cigarettes  . Smokeless tobacco: Never Used  Vaping Use  . Vaping Use: Never used  Substance and Sexual Activity  . Alcohol use: Yes    Alcohol/week: 24.0 standard drinks    Types: 24 Cans of beer per week  . Drug use: Never  . Sexual activity: Not on file

## 2021-03-19 ENCOUNTER — Other Ambulatory Visit: Payer: Self-pay | Admitting: Family Medicine

## 2021-03-19 ENCOUNTER — Other Ambulatory Visit: Payer: Self-pay

## 2021-03-19 ENCOUNTER — Ambulatory Visit
Admission: RE | Admit: 2021-03-19 | Discharge: 2021-03-19 | Disposition: A | Discharge status: Home / Self Care | Payer: Managed Care, Other (non HMO) | Source: Ambulatory Visit | Attending: Family Medicine | Admitting: Family Medicine

## 2021-03-19 DIAGNOSIS — F17200 Nicotine dependence, unspecified, uncomplicated: Secondary | ICD-10-CM

## 2021-03-19 DIAGNOSIS — R059 Cough, unspecified: Secondary | ICD-10-CM

## 2021-03-19 DIAGNOSIS — R634 Abnormal weight loss: Secondary | ICD-10-CM

## 2021-07-08 ENCOUNTER — Telehealth: Payer: Self-pay | Admitting: Family Medicine

## 2021-07-08 NOTE — Telephone Encounter (Signed)
Donnella Sham from [facility] called to check on referral sent back in August for Adam Potter. [phone] office number.

## 2021-07-08 NOTE — Telephone Encounter (Signed)
Appt scheduled

## 2021-08-26 ENCOUNTER — Encounter: Payer: Self-pay | Admitting: Endocrinology

## 2021-08-26 ENCOUNTER — Ambulatory Visit: Payer: Managed Care, Other (non HMO) | Admitting: Endocrinology

## 2021-08-26 ENCOUNTER — Other Ambulatory Visit: Payer: Self-pay

## 2021-08-26 DIAGNOSIS — E1165 Type 2 diabetes mellitus with hyperglycemia: Secondary | ICD-10-CM | POA: Insufficient documentation

## 2021-08-26 DIAGNOSIS — E139 Other specified diabetes mellitus without complications: Secondary | ICD-10-CM | POA: Insufficient documentation

## 2021-08-26 DIAGNOSIS — E119 Type 2 diabetes mellitus without complications: Secondary | ICD-10-CM

## 2021-08-26 LAB — POCT GLYCOSYLATED HEMOGLOBIN (HGB A1C): Hemoglobin A1C: 8 % — AB (ref 4.0–5.6)

## 2021-08-26 MED ORDER — PIOGLITAZONE HCL 45 MG PO TABS: 45.0000 mg | ORAL_TABLET | Freq: Every day | ORAL | 3 refills | Status: DC

## 2021-08-26 NOTE — Patient Instructions (Addendum)
good diet and exercise significantly improve the control of your diabetes.  please let me know if you wish to be referred to a dietician.  high blood sugar is very risky to your health.  you should see an eye doctor and dentist every year.  It is very important to get all recommended vaccinations.  Controlling your blood pressure and cholesterol drastically reduces the damage diabetes does to your body.  Those who smoke should quit.  Please discuss these with your doctor.  check your blood sugar once a day.  vary the time of day when you check, between before the 3 meals, and at bedtime.  also check if you have symptoms of your blood sugar being too high or too low.  please keep a record of the readings and bring it to your next appointment here (or you can bring the meter itself).  You can write it on any piece of paper.  please call us sooner if your blood sugar goes below 70, or if most of your readings are over 200.  I have sent a prescription to your pharmacy, to add pioglitazone.  Please continue the same other diabetes medications.   However, please reduce the glimepiride as your blood sugar allows.  Please let us know if you need help with this.   Please come back for a follow-up appointment in 2 months.

## 2021-08-26 NOTE — Progress Notes (Signed)
Subjective:    Patient ID: Adam Potter, male    DOB: 02/28/1976, 46 y.o.   MRN: [id number]  HPI pt is referred by Jarrett Soho, PA, for diabetes.  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  He works in [employer], 2PM-11PM.  He takes 3 oral meds.  He has lost 35 lbs x 7 years--unintentional.  He has mild hypoglycemia approx 1-2 times per week.  He does not check cbg's.  He eats meals at 1PM and .  However, he tests food throughout his shift.   Past Medical History:  Diagnosis Date   Asthma    Diabetes mellitus without complication (HCC)     Past Surgical History:  Procedure Laterality Date   KNEE SURGERY Left     Social History   Socioeconomic History   Marital status: Single    Spouse name: Not on file   Number of children: Not on file   Years of education: Not on file   Highest education level: Not on file  Occupational History   Not on file  Tobacco Use   Smoking status: Every Day    Packs/day: 1.00    Years: 24.00    Pack years: 24.00    Types: Cigarettes   Smokeless tobacco: Never  Vaping Use   Vaping Use: Never used  Substance and Sexual Activity   Alcohol use: Yes    Alcohol/week: 24.0 standard drinks    Types: 24 Cans of beer per week   Drug use: Never   Sexual activity: Not on file  Other Topics Concern   Not on file  Social History Narrative   Not on file   Social Determinants of Health   Financial Resource Strain: Not on file  Food Insecurity: Not on file  Transportation Needs: Not on file  Physical Activity: Not on file  Stress: Not on file  Social Connections: Not on file  Intimate Partner Violence: Not on file    Current Outpatient Medications on File Prior to Visit  Medication Sig Dispense Refill   albuterol (VENTOLIN HFA) 108 (90 Base) MCG/ACT inhaler Inhale 2  puffs into the lungs every 6 (six) hours as needed for wheezing or shortness of breath.     glimepiride (AMARYL) 4 MG tablet 1 TABLET WITH BREAKFAST OR THE FIRST MAIN MEAL OF THE DAY ONCE A DAY ORALLY 90 DAY(S)     ibuprofen (ADVIL,MOTRIN) 200 MG tablet Take 200 mg by mouth every 6 (six) hours as needed.     metFORMIN (GLUCOPHAGE-XR) 500 MG 24 hr tablet Take 500 mg by mouth 2 (two) times daily.  1   budesonide-formoterol (SYMBICORT) 80-4.5 MCG/ACT inhaler Inhale 2 puffs into the lungs 2 (two) times daily. (Patient not taking: Reported on 08/26/2021) 1 Inhaler 11   sitaGLIPtin (JANUVIA) 100 MG tablet Take 1 tablet by mouth daily.     No current facility-administered medications on file prior to visit.    No Known Allergies  Family History  Problem Relation Age of Onset   Diabetes Neg Hx     There were no vitals taken for this visit.   Review of Systems denies n/v, and depression.      Objective:   Physical Exam VITAL SIGNS:  See vs page GENERAL: no distress Pulses: dorsalis pedis intact bilat.  MSK: no deformity of the feet.   CV: no leg edema.   Skin:  no ulcer on the feet.  normal color and temp on the feet.   Neuro: sensation is intact to touch on the feet.     Lab Results  Component Value Date   HGBA1C 8.0 (A) 08/26/2021   outside test results are reviewed: TSH=normal  I have reviewed outside records, and summarized: Pt was noted to have elevated A1c, and referred here.  No other cause of weight loss was found.     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  please let me know if you wish to be referred to a dietician.  high blood sugar is very risky to your health.  you should see an eye doctor and dentist every year.  It is very important to get all recommended vaccinations.  Controlling your blood pressure and  cholesterol drastically reduces the damage diabetes does to your body.  Those who smoke should quit.  Please discuss these with your doctor.  check your blood sugar once a day.  vary the time of day when you check, between before the 3 meals, and at bedtime.  also check if you have symptoms of your blood sugar being too high or too low.  please keep a record of the readings and bring it to your next appointment here (or you can bring the meter itself).  You can write it on any piece of paper.  please call us sooner if your blood sugar goes below 70, or if most of your readings are over 200.  I have sent a prescription to your pharmacy, to add pioglitazone.  Please continue the same other diabetes medications.   However, please reduce the glimepiride as your blood sugar allows.  Please let us know if you need help with this.   Please come back for a follow-up appointment in 2 months.

## 2021-09-14 IMAGING — MR MR KNEE*L* W/O CM
4 of 7 series · 21 of 40 positions shown · non-contrast
Comparison: None.

CLINICAL DATA: Knee pain radiates down to the lower leg. No known
injury.

EXAM:
MRI OF THE LEFT KNEE WITHOUT CONTRAST
TECHNIQUE: Multiplanar, multisequence MR imaging of the knee was performed. No
intravenous contrast was administered.

[Series 3: T2 fat-sat · axial · 4.0mm · 0.50mm/px · z∈[-58,+72]mm · 4 of 27 slices shown]
[im 1/27]
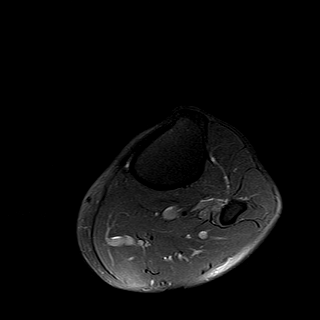
[im 6/27]
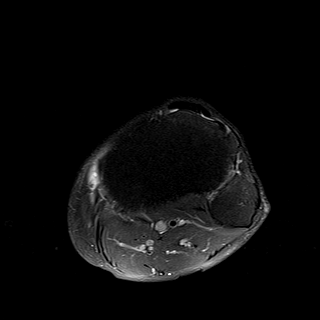
[im 16/27]
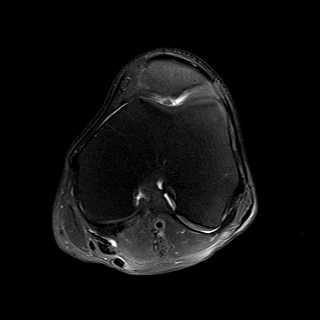
[im 27/27]
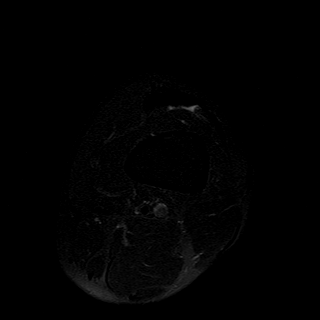

[Series 7: PD fat-sat · sagittal · 3.0mm · 0.29mm/px · 7 of 28 slices shown (1 of 3)]
[im 1/28]
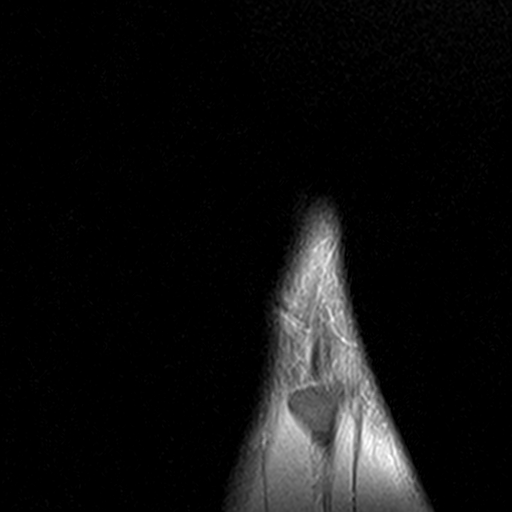
[im 5/28]
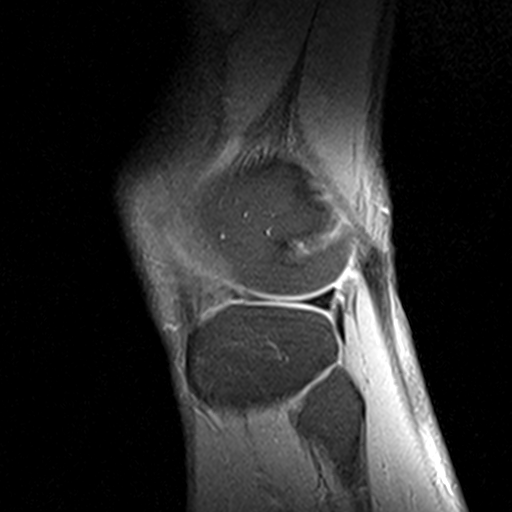
[im 10/28]
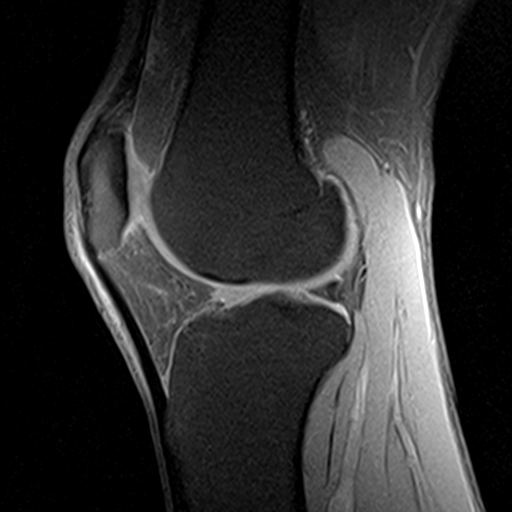
[im 14/28]
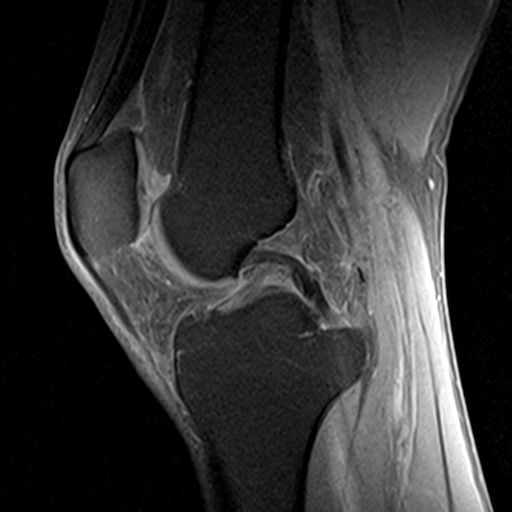
[im 19/28]
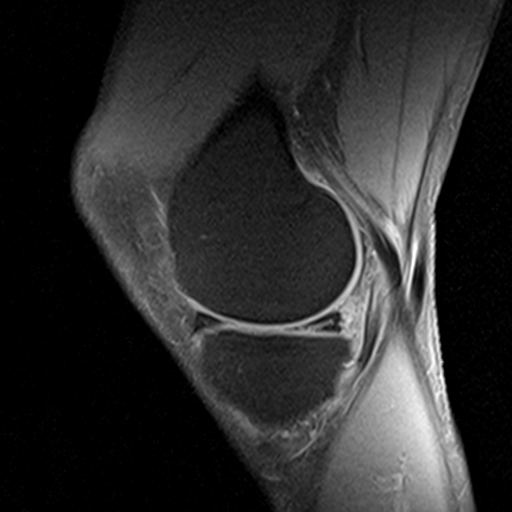
[im 23/28]
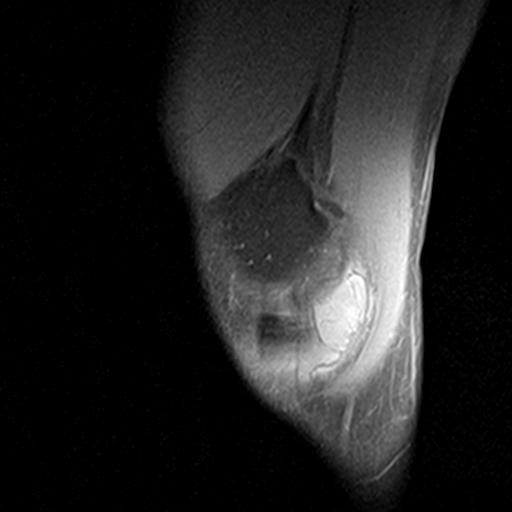
[im 28/28]
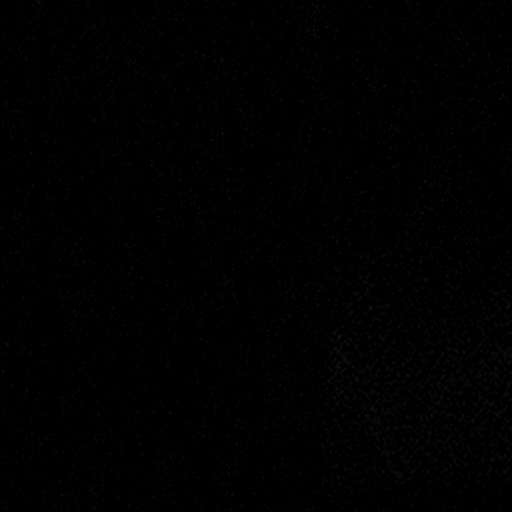

[Series 8: PD fat-sat · coronal · 3.0mm · 0.29mm/px · 7 of 28 slices shown (2 of 3)]
[im 1/28]
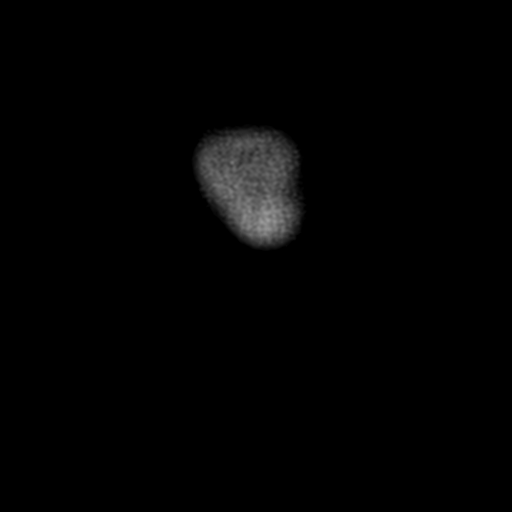
[im 5/28]
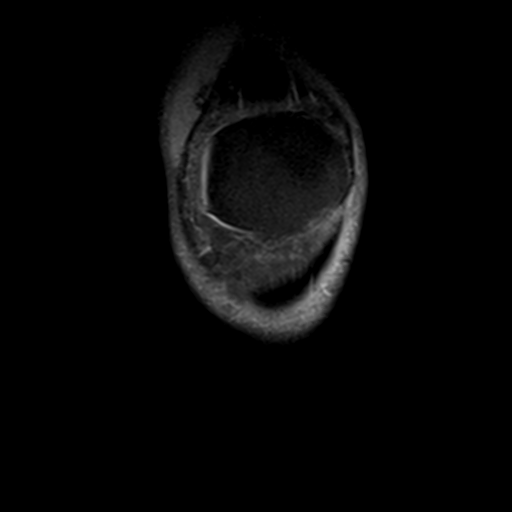
[im 10/28]
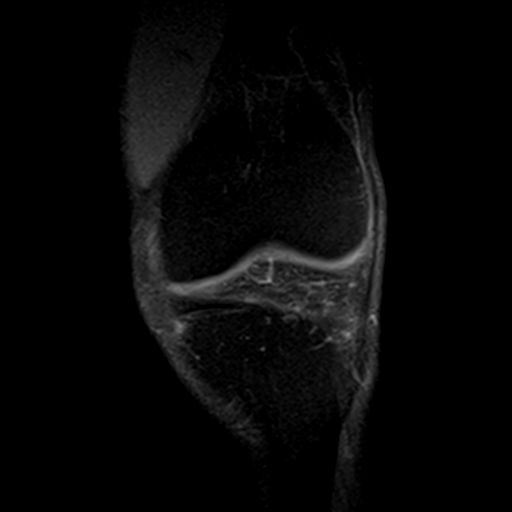
[im 14/28]
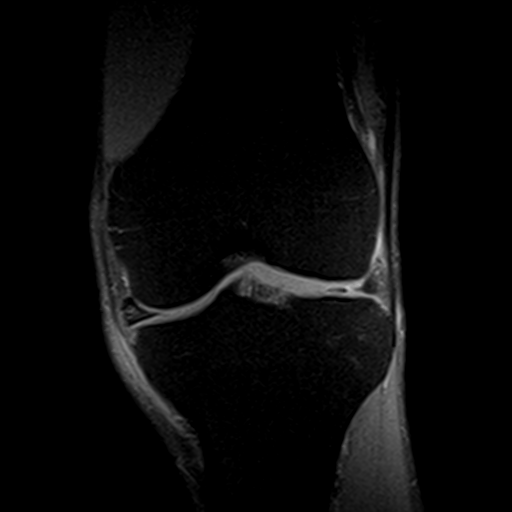
[im 19/28]
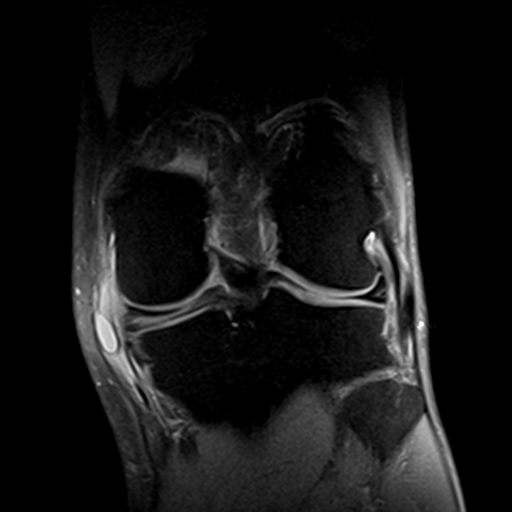
[im 23/28]
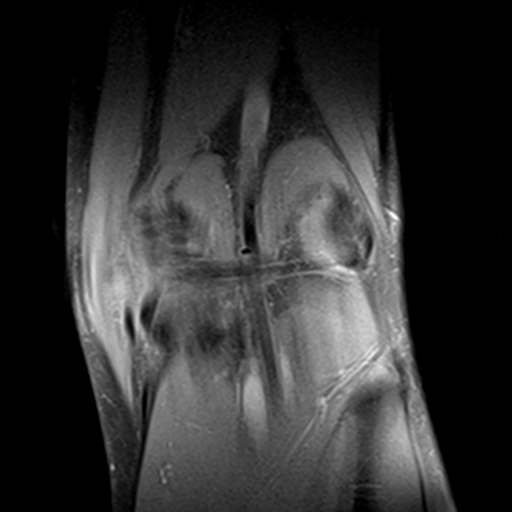
[im 28/28]
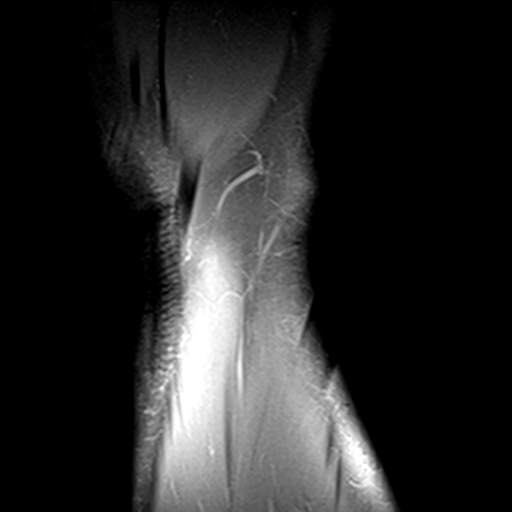

[Series 9: PD fat-sat · oblique · 2.3mm · 0.29mm/px · 3 of 11 slices shown (3 of 3)]
[im 1/11]
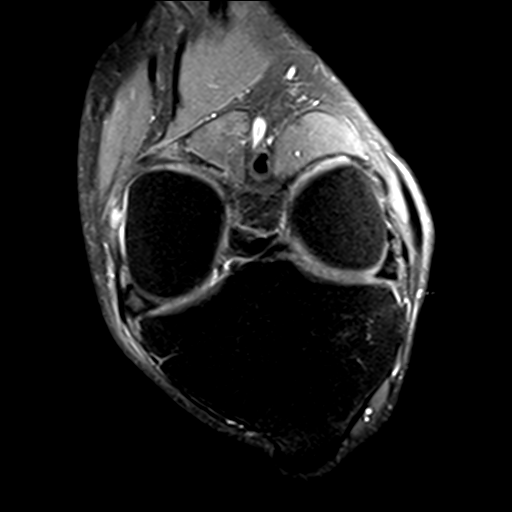
[im 6/11]
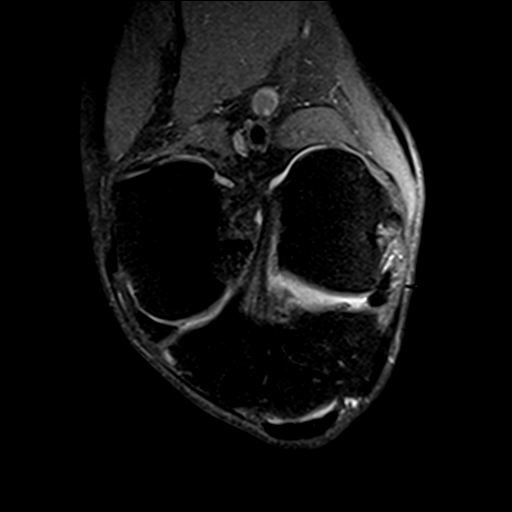
[im 11/11]
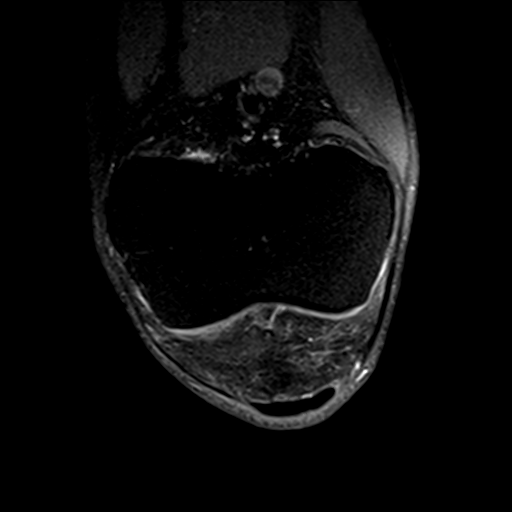

[21 of 40 positions shown; findings below may reference images not displayed]

FINDINGS: MENISCI

Medial meniscus: Large oblique tear of the posterior horn-body
junction of the medial meniscus extending into the remainder of the
posterior horn of medial meniscus towards the root and inferior
articular surface. Large parameniscal cyst measuring 22 x 12 mm
along the periphery of the posterior horn-body junction.

Lateral meniscus: Radial tear of the anterior horn of the lateral
meniscus.

LIGAMENTS

Cruciates:  Intact ACL and PCL.

Collaterals: Medial collateral ligament is intact. Lateral
collateral ligament complex is intact.

CARTILAGE

Patellofemoral: Full-thickness cartilage fissure involving the
patellar apex with subchondral reactive marrow changes.

Medial: Partial-thickness cartilage loss of the medial femorotibial
compartment.

Lateral: Partial-thickness cartilage loss of the lateral
femorotibial compartment.

Joint: No joint effusion. Mild edema in Hoffa's fat. No plical
thickening.

Popliteal Fossa:  Tiny Baker's cyst. Intact popliteus tendon.

Extensor Mechanism: Intact quadriceps tendon. Intact patellar
tendon. Intact medial patellar retinaculum. Intact lateral patellar
retinaculum. Intact MPFL.

Bones:  No acute osseous abnormality. No aggressive lesion.

Other: No fluid collection or hematoma. Muscles are normal.
IMPRESSION: 1. Large oblique tear of the posterior horn-body junction of the
medial meniscus extending into the remainder of the posterior horn
of medial meniscus towards the root and inferior articular surface.
Large parameniscal cyst measuring 22 x 12 mm along the periphery of
the posterior horn-body junction.
2. Radial tear of the anterior horn of the lateral meniscus.
3. Tricompartmental cartilage abnormalities as described above.

## 2021-10-28 ENCOUNTER — Encounter: Payer: Self-pay | Admitting: Endocrinology

## 2021-10-28 ENCOUNTER — Other Ambulatory Visit: Payer: Self-pay

## 2021-10-28 ENCOUNTER — Ambulatory Visit: Payer: Managed Care, Other (non HMO) | Admitting: Endocrinology

## 2021-10-28 VITALS — BP 114/76 | HR 89 | Ht 66.0 in | Wt 120.8 lb

## 2021-10-28 DIAGNOSIS — E119 Type 2 diabetes mellitus without complications: Secondary | ICD-10-CM

## 2021-10-28 LAB — POCT GLYCOSYLATED HEMOGLOBIN (HGB A1C): Hemoglobin A1C: 8.5 % — AB (ref 4.0–5.6)

## 2021-10-28 MED ORDER — GLIMEPIRIDE 4 MG PO TABS: 4.0000 mg | ORAL_TABLET | Freq: Every day | ORAL | 3 refills | Status: DC

## 2021-10-28 MED ORDER — METFORMIN HCL ER 500 MG PO TB24: 1500.0000 mg | ORAL_TABLET | Freq: Every day | ORAL | 3 refills | Status: DC

## 2021-10-28 NOTE — Progress Notes (Signed)
? ?Subjective:  ? ? Patient ID: Adam Potter, male    DOB: 03/29/1976, 46 y.o.   MRN: [id number] ? 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  He takes the other 3 meds as rx'ed.  He does not check cbg.  pt states he feels well in general.  ?Past Medical History:  ?Diagnosis Date  ? Asthma   ? Diabetes mellitus without complication (HCC)   ? ? ?Past Surgical History:  ?Procedure Laterality Date  ? KNEE SURGERY Left   ? ? ?Social History  ? ?Socioeconomic History  ? Marital status: Single  ?  Spouse name: Not on file  ? Number of children: Not on file  ? Years of education: Not on file  ? Highest education level: Not on file  ?Occupational History  ? Not on file  ?Tobacco Use  ? Smoking status: Every Day  ?  Packs/day: 1.00  ?  Years: 24.00  ?  Pack years: 24.00  ?  Types: Cigarettes  ? Smokeless tobacco: Never  ?Vaping Use  ? Vaping Use: Never used  ?Substance and Sexual Activity  ? Alcohol use: Yes  ?  Alcohol/week: 24.0 standard drinks  ?  Types: 24 Cans of beer per week  ? Drug use: Never  ? Sexual activity: Not on file  ?Other Topics Concern  ? Not on file  ?Social History Narrative  ? Not on file  ? ?Social Determinants of Health  ? ?Financial Resource Strain: Not on file  ?Food Insecurity: Not on file  ?Transportation Needs: Not on file  ?Physical Activity: Not on file  ?Stress: Not on file  ?Social Connections: Not on file  ?Intimate Partner Violence: Not on file  ? ? ?Current Outpatient Medications on File Prior to Visit  ?Medication Sig Dispense Refill  ? albuterol (VENTOLIN HFA) 108 (90 Base) MCG/ACT inhaler Inhale 2 puffs into the lungs every 6 (six) hours as needed for wheezing or shortness  of breath.    ? budesonide-formoterol (SYMBICORT) 80-4.5 MCG/ACT inhaler Inhale 2 puffs into the lungs 2 (two) times daily. 1 Inhaler 11  ? ibuprofen (ADVIL,MOTRIN) 200 MG tablet Take 200 mg by mouth every 6 (six) hours as needed.    ? pioglitazone (ACTOS) 45 MG tablet Take 1 tablet (45 mg total) by mouth daily. 90 tablet 3  ? sitaGLIPtin (JANUVIA) 100 MG tablet Take 1 tablet by mouth daily.    ? ?No current facility-administered medications on file prior to visit.  ? ? ?No Known Allergies ? ?Family History  ?Problem Relation Age of Onset  ? Diabetes Neg Hx   ? ? ?BP 114/76 (BP Location: Left Arm, Patient Position: Sitting, Cuff Size: Normal)   Pulse 89   Ht 5\' 6"  (1.676 m)   Wt 120 lb 12.8 oz (54.8 kg)   SpO2 99%   BMI 19.50 kg/m?  ? ?Review of Systems ?He denies hypoglycemia sxs (sweat/tremor/HA/hunger/palpitations).   ?   ?Objective:  ? Physical Exam ?VITAL SIGNS:  See vs page ?GENERAL: no distress.   ?EXT: no leg edema.   ? ?A1c=8.5% ?   ?Assessment & Plan:  ?Type 2 DM: uncontrolled ? ?Patient Instructions  ?check your blood  sugar once a day.  vary the time of day when you check, between before the 3 meals, and at bedtime.  also check if you have symptoms of your blood sugar being too high or too low.  please keep a record of the readings and bring it to your next appointment here (or you can bring the meter itself).  You can write it on any piece of paper.  please call us sooner if your blood sugar goes below 70, or if most of your readings are over 200.   ?For now, please take a whole pill of glimepiride each morning.  ?Please continue the same other 3 diabetes medications.   ?However, please reduce the glimepiride as your blood sugar allows.  Please let us know if you need help with this.   ?Please come back for a follow-up appointment in May.   ? ? ?

## 2021-10-28 NOTE — Patient Instructions (Addendum)
check your blood sugar once a day.  vary the time of day when you check, between before the 3 meals, and at bedtime.  also check if you have symptoms of your blood sugar being too high or too low.  please keep a record of the readings and bring it to your next appointment here (or you can bring the meter itself).  You can write it on any piece of paper.  please call us sooner if your blood sugar goes below 70, or if most of your readings are over 200.   ?For now, please take a whole pill of glimepiride each morning.  ?Please continue the same other 3 diabetes medications.   ?However, please reduce the glimepiride as your blood sugar allows.  Please let us know if you need help with this.   ?Please come back for a follow-up appointment in May.   ?

## 2021-12-16 ENCOUNTER — Ambulatory Visit: Payer: Managed Care, Other (non HMO) | Admitting: Endocrinology

## 2021-12-18 ENCOUNTER — Encounter: Payer: Self-pay | Admitting: Internal Medicine

## 2021-12-18 ENCOUNTER — Ambulatory Visit: Payer: Managed Care, Other (non HMO) | Admitting: Internal Medicine

## 2021-12-18 VITALS — BP 110/70 | HR 100 | Ht 66.0 in | Wt 119.8 lb

## 2021-12-18 DIAGNOSIS — E1165 Type 2 diabetes mellitus with hyperglycemia: Secondary | ICD-10-CM

## 2021-12-18 DIAGNOSIS — E139 Other specified diabetes mellitus without complications: Secondary | ICD-10-CM | POA: Diagnosis not present

## 2021-12-18 DIAGNOSIS — E1142 Type 2 diabetes mellitus with diabetic polyneuropathy: Secondary | ICD-10-CM | POA: Diagnosis not present

## 2021-12-18 LAB — POCT GLYCOSYLATED HEMOGLOBIN (HGB A1C): Hemoglobin A1C: 8.9 % — AB (ref 4.0–5.6)

## 2021-12-18 LAB — POCT GLUCOSE (DEVICE FOR HOME USE): Glucose Fasting, POC: 313 mg/dL — AB (ref 70–99)

## 2021-12-18 MED ORDER — INSULIN PEN NEEDLE 32G X 4 MM MISC: 3 refills | Status: DC

## 2021-12-18 MED ORDER — TRESIBA FLEXTOUCH 100 UNIT/ML ~~LOC~~ SOPN: 8.0000 [IU] | PEN_INJECTOR | Freq: Every day | SUBCUTANEOUS | 3 refills | Status: DC

## 2021-12-18 MED ORDER — FREESTYLE LIBRE 3 SENSOR MISC: 1.0000 | 3 refills | Status: DC

## 2021-12-18 NOTE — Progress Notes (Signed)
Patient ID: Adam Potter, male   DOB: 03-30-1976, 46 y.o.   MRN: [id number]  HPI: Adam Potter is a 46 y.o.-year-old male, returning for follow-up for DM, dx in 2015,  insulin-dependent, uncontrolled, with complications (peripheral neuropathy). Pt. previously saw Dr. Loanne Drilling, last visit 1.5 months ago. He is here with his girlfriend who offers part of the history especially related to his medications, diet, weight, activity.  Reviewed HbA1c levels: Lab Results  Component Value Date   HGBA1C 8.5 (A) 10/28/2021   HGBA1C 8.0 (A) 08/26/2021  03/19/2021: HbA1c 6.6%  Pt is on a regimen of: - Metformin ER 1000 >> 1500 mg a day - increased 3 weeks ago - Glimepiride 4 >> 2 mg before breakfast - decreased 4 days ago because leg pain - Januvia 100 mg  before breakfast - Actos 45 mg  before breakfast  - started in 08/2021. He refused insulin in the past.   Pt is not checking blood sugars.  He does have a meter at home.  Glucometer:?  They are not sure  - no CKD, last BUN/creatinine:   2021-03-19     Albumin 4.8   3.4-4.8  ALP 35   38-126  ALT 11   0-52  Anion Gap 11.5   6.0-20.0  AST 15   0-39  BUN 13   6-26  CO2 29   22-32  CA-corrected 8.80   8.60-10.30  Calcium 9.6   8.6-10.3  Chloride 104   98-107  Creatinine 0.70   0.60-1.30  eGFR2021 115   >60  Glucose 113   70-99  Potassium 5.5   3.5-5.5  Sodium 139   136-145  Protein, Total 7.4   6.0-8.3  TBIL 0.5   0.3-1.0  Microalbumin Panel   2021-03-19    MA/CR ratio <10.9   0.0-30.0  UCR 64      UMA <0.7       No results found for: BUN, CREATININE He is not on ACE inhibitor/ARB.  - last set of lipids:  2021-03-19     LDL Chol Calc (NIH) 91   0-99  CHOL/HDL 2.2   2.0-4.0  Cholesterol 185   <200  HDLD 84   30-70  LDL Chol Calc (NIH) 91   0-99  NHDL 101   0-129  Triglyceride 49   0-199   No results found for: CHOL, HDL, LDLCALC, LDLDIRECT, TRIG, CHOLHDL He is not on a statin. He refused these in the past.    - last eye  exam was in 2020. No DR reportedly.   - + numbness and tingling in his feet. On Gabapentin - 300 mg prn.  ROS: + see HPI He does have increased urination, but no blurry vision, nausea, chest pain.  Past Medical History:  Diagnosis Date   Asthma    Diabetes mellitus without complication ([place])    Past Surgical History:  Procedure Laterality Date   KNEE SURGERY Left    Social History   Socioeconomic History   Marital status: Single    Spouse name: Not on file   Number of children: Not on file   Years of education: Not on file   Highest education level: Not on file  Occupational History   Not on file  Tobacco Use   Smoking status: Every Day    Packs/day: 1.00    Years: 24.00    Pack years: 24.00    Types: Cigarettes   Smokeless tobacco: Never  Vaping Use   Vaping Use:  Never used  Substance and Sexual Activity   Alcohol use: Yes    Alcohol/week: 24.0 standard drinks    Types: 24 Cans of beer per week   Drug use: Never   Sexual activity: Not on file  Other Topics Concern   Not on file  Social History Narrative   Not on file   Social Determinants of Health   Financial Resource Strain: Not on file  Food Insecurity: Not on file  Transportation Needs: Not on file  Physical Activity: Not on file  Stress: Not on file  Social Connections: Not on file  Intimate Partner Violence: Not on file   Current Outpatient Medications on File Prior to Visit  Medication Sig Dispense Refill   albuterol (VENTOLIN HFA) 108 (90 Base) MCG/ACT inhaler Inhale 2 puffs into the lungs every 6 (six) hours as needed for wheezing or shortness of breath.     budesonide-formoterol (SYMBICORT) 80-4.5 MCG/ACT inhaler Inhale 2 puffs into the lungs 2 (two) times daily. 1 Inhaler 11   glimepiride (AMARYL) 4 MG tablet Take 1 tablet (4 mg total) by mouth daily with breakfast. 90 tablet 3   ibuprofen (ADVIL,MOTRIN) 200 MG tablet Take 200 mg by mouth every 6 (six) hours as needed.     metFORMIN  (GLUCOPHAGE-XR) 500 MG 24 hr tablet Take 3 tablets (1,500 mg total) by mouth daily. 270 tablet 3   pioglitazone (ACTOS) 45 MG tablet Take 1 tablet (45 mg total) by mouth daily. 90 tablet 3   sitaGLIPtin (JANUVIA) 100 MG tablet Take 1 tablet by mouth daily.     No current facility-administered medications on file prior to visit.   No Known Allergies Family History  Problem Relation Age of Onset   Diabetes Neg Hx    PE: BP 110/70 (BP Location: Right Arm, Patient Position: Sitting, Cuff Size: Normal)   Pulse 100   Ht 5\' 6"  (1.676 m)   Wt 119 lb 12.8 oz (54.3 kg)   SpO2 98%   BMI 19.34 kg/m  Wt Readings from Last 3 Encounters:  12/18/21 119 lb 12.8 oz (54.3 kg)  10/28/21 120 lb 12.8 oz (54.8 kg)  03/04/19 124 lb 12.8 oz (56.6 kg)   Constitutional: thin, in NAD Eyes: PERRLA, EOMI, no exophthalmos ENT: moist mucous membranes, no thyromegaly, no cervical lymphadenopathy Cardiovascular: RRR, No MRG Respiratory: CTA B Musculoskeletal: no deformities, strength intact in all 4 Skin: moist, warm, no rashes Neurological: no tremor with outstretched hands, DTR normal in all 4  ASSESSMENT: 1. DM, insulin-dependent, uncontrolled, with complications - PN  PLAN:  1. Patient with long-standing, uncontrolled diabetes, on oral antidiabetic regimen, with still poor control.  At today's visit, we checked his blood sugar after he ate lunch and it was 336.  Latest HbA1c was 8.5% 1.5 months ago.  At that time, Dr. Loanne Drilling increased his glimepiride.  However, he tells me that he was not able to tolerate this, as he developed pain in his legs and he backed off the dose.  Also, he realized recently that he was not taking 3 metformin doses a day, but only 2.  He just increased to 3 tablets approximately 2 to 3 weeks ago. -Unfortunately, he is not checking blood sugars at home. -We discussed that it appears that he is quite glucotoxicity.  He lost weight lately and he has significant neuropathy and has  increased urination.  We discussed that due to the thin body habitus andf poor diabetes control on 4 oral medications, will need to  check him for type 1 diabetes.  Since his blood sugars are in the 300s now, we cannot check his insulin production but I can still check his antipancreatic antibodies.  However, I strongly encouraged him to start insulin.  At this visit, he reluctantly agrees.  We demonstrated correct injection techniques and how to use the insulin pen.  In fact, we gave him a [place] pen and gave him the first injection here in the office.  As of now, I also advised him to stop the glimepiride to avoid further strain on his pancreas but will continue the rest of his oral medications.  He is worried that if he starts insulin, he will continue on it for the rest of his life.  We discussed that if he has type 1 diabetes, which I highly suspect, this will be the case, but this is very important to make sure that he is not developing DKA.  We discussed about this is a potentially fatal complication of diabetes. -We discussed about the importance of checking his blood sugars.  I advised him that without checking blood sugars, we will not be able to gain control of his diabetes.  He is reticent to prick his fingers so we discussed about possibly using a CGM.  I sent a prescription for the freestyle libre 3 CGM to his pharmacy and advised him and his girlfriend how to use it. -At today's visit, I also advised him to try to drink less alcohol.  I explained the mechanism by which alcohol can cause or worsen hypoglycemia.  He drinks approximately 6 beers every night.  However, he is telling me that he is not ready to decrease the amount or quit anytime soon. - I suggested to:  Patient Instructions  Please continue: - Metformin ER 1500 mg a day - Januvia 100 mg  before breakfast - Actos 45 mg  before breakfast   Stop: - Glimepiride  Try to start: - Tresiba 8 units daily. Increase by 2 units every 3  days until sugars in am are <140  Please stop at the lab.  Please return in 1.5-2 months with your sugar log.   - check sugars at different times of the day - check 4x a day, rotating checks - discussed about CBG targets for treatment: 80-130 mg/dL before meals and <180 mg/dL after meals; target HbA1c <7%. - given sugar log and advised how to fill it and to bring it at next appt  - given foot care handout  - given instructions for hypoglycemia management "15-15 rule"  - advised for yearly eye exams >> he is due - Return to clinic in 1.5-2 mo with sugar log   Orders Placed This Encounter  Procedures   Anti-islet cell antibody   ZNT8 Antibodies   Glutamic acid decarboxylase auto abs   POCT glycosylated hemoglobin (Hb A1C)   POCT Glucose (Device for Home Use)   - Total time spent for the visit: 40 min, in precharting, obtaining medical information from the patient, his girlfriend, the chart, reviewing Dr. Cordelia Pen last note, patient's previous labs, evaluations, and treatments, reviewing his symptoms, counseling him about his diabetes (please see the discussed topics above), and developing a plan to further investigate and treat it; he had a number of questions which I addressed.  Component     Latest Ref Rng 12/18/2021  Glucose Fasting, POC     70 - 99 mg/dL 313 !   Islet Cell Ab     Neg:<1:1  Negative   ZNT8 Antibodies     <15 U/mL <10   Glutamic Acid Decarb Ab     <5 IU/mL 226 (H)     Antidiabetic autoimmunity, consistent with LADA.  Philemon Kingdom, MD PhD [facility] Endocr[facility]

## 2021-12-18 NOTE — Patient Instructions (Addendum)
Please continue: ?- Metformin ER 1500 mg a day ?- Januvia 100 mg  before breakfast ?- Actos 45 mg  before breakfast  ? ?Stop: ?- Glimepiride ? ?Try to start: ?- Tresiba 8 units daily. Increase by 2 units every 3 days until sugars in am are <140 ? ?Please stop at the lab. ? ?Please return in 1.5-2 months with your sugar log.  ? ?PATIENT INSTRUCTIONS FOR TYPE 2 DIABETES: ? ?**Please join MyChart!** - see attached instructions about how to join if you have not done so already. ? ?DIET AND EXERCISE ?Diet and exercise is an important part of diabetic treatment.  ?We recommended aerobic exercise in the form of brisk walking (working between 40-60% of maximal aerobic capacity, similar to brisk walking) for 150 minutes per week (such as 30 minutes five days per week) along with 3 times per week performing 'resistance' training (using various gauge rubber tubes with handles) 5-10 exercises involving the major muscle groups (upper body, lower body and core) performing 10-15 repetitions (or near fatigue) each exercise. Start at half the above goal but build slowly to reach the above goals. If limited by weight, joint pain, or disability, we recommend daily walking in a swimming pool with water up to waist to reduce pressure from joints while allow for adequate exercise.   ? ?BLOOD GLUCOSES ?Monitoring your blood glucoses is important for continued management of your diabetes. Please check your blood glucoses 2-4 times a day: fasting, before meals and at bedtime (you can rotate these measurements - e.g. one day check before the 3 meals, the next day check before 2 of the meals and before bedtime, etc.).  ? ?HYPOGLYCEMIA (low blood sugar) ?Hypoglycemia is usually a reaction to not eating, exercising, or taking too much insulin/ other diabetes drugs.  ?Symptoms include tremors, sweating, hunger, confusion, headache, etc. ?Treat IMMEDIATELY with 15 grams of Carbs: ?4 glucose tablets ?? cup regular juice/soda ?2 tablespoons  raisins ?4 teaspoons sugar ?1 tablespoon honey ?Recheck blood glucose in 15 mins and repeat above if still symptomatic/blood glucose <100. ? ?RECOMMENDATIONS TO REDUCE YOUR RISK OF DIABETIC COMPLICATIONS: ?* Take your prescribed MEDICATION(S) ?* Follow a DIABETIC diet: Complex carbs, fiber rich foods, (monounsaturated and polyunsaturated) fats ?* AVOID saturated/trans fats, high fat foods, >2,300 mg salt per day. ?* EXERCISE at least 5 times a week for 30 minutes or preferably daily.  ?* DO NOT SMOKE OR DRINK more than 1 drink a day. ?* Check your FEET every day. Do not wear tightfitting shoes. Contact us if you develop an ulcer ?* See your EYE doctor once a year or more if needed ?* Get a FLU shot once a year ?* Get a PNEUMONIA vaccine once before and once after age 39 years ? ?GOALS:  ?* Your Hemoglobin A1c of <7%  ?* fasting sugars need to be <130 ?* after meals sugars need to be <180 (2h after you start eating) ?* Your Systolic BP should be 140 or lower  ?* Your Diastolic BP should be 80 or lower  ?* Your HDL (Good Cholesterol) should be 40 or higher  ?* Your LDL (Bad Cholesterol) should be 100 or lower. ?* Your Triglycerides should be 150 or lower  ?* Your Urine microalbumin (kidney function) should be <30 ?* Your Body Mass Index should be 25 or lower  ? ? ? ?

## 2021-12-19 LAB — ANTI-ISLET CELL ANTIBODY: Islet Cell Ab: NEGATIVE

## 2021-12-27 LAB — ZNT8 ANTIBODIES: ZNT8 Antibodies: <10 U/mL (ref <15)

## 2021-12-27 LAB — GLUTAMIC ACID DECARBOXYLASE AUTO ABS: Glutamic Acid Decarb Ab: 226 IU/mL — ABNORMAL HIGH (ref <5)

## 2022-02-03 ENCOUNTER — Telehealth: Payer: Self-pay

## 2022-02-03 NOTE — Telephone Encounter (Signed)
Pt called and lvm with questions. Mychart message sent to follow up.

## 2022-02-28 ENCOUNTER — Encounter: Payer: Self-pay | Admitting: Internal Medicine

## 2022-02-28 ENCOUNTER — Ambulatory Visit: Payer: Managed Care, Other (non HMO) | Admitting: Internal Medicine

## 2022-02-28 VITALS — BP 100/64 | HR 94 | Ht 66.0 in | Wt 124.0 lb

## 2022-02-28 DIAGNOSIS — E139 Other specified diabetes mellitus without complications: Secondary | ICD-10-CM

## 2022-02-28 LAB — LIPID PANEL
Cholesterol: 216 mg/dL — ABNORMAL HIGH (ref 0–200)
HDL: 87.6 mg/dL (ref >39.00)
LDL Cholesterol: 113 mg/dL — ABNORMAL HIGH (ref 0–99)
NonHDL: 128.86
Total CHOL/HDL Ratio: 2
Triglycerides: 78 mg/dL (ref 0.0–149.0)
VLDL: 15.6 mg/dL (ref 0.0–40.0)

## 2022-02-28 LAB — COMPREHENSIVE METABOLIC PANEL
ALT: 17 U/L (ref 0–53)
AST: 26 U/L (ref 0–37)
Albumin: 4.9 g/dL (ref 3.5–5.2)
Alkaline Phosphatase: 44 U/L (ref 39–117)
BUN: 18 mg/dL (ref 6–23)
CO2: 28 mEq/L (ref 19–32)
Calcium: 9.7 mg/dL (ref 8.4–10.5)
Chloride: 101 mEq/L (ref 96–112)
Creatinine, Ser: 0.97 mg/dL (ref 0.40–1.50)
GFR: 92.96 mL/min (ref >60.00)
Glucose, Bld: 109 mg/dL — ABNORMAL HIGH (ref 70–99)
Potassium: 4.5 mEq/L (ref 3.5–5.1)
Sodium: 137 mEq/L (ref 135–145)
Total Bilirubin: 0.7 mg/dL (ref 0.2–1.2)
Total Protein: 7.6 g/dL (ref 6.0–8.3)

## 2022-02-28 LAB — POCT GLYCOSYLATED HEMOGLOBIN (HGB A1C): Hemoglobin A1C: 7.2 % — AB (ref 4.0–5.6)

## 2022-02-28 LAB — MICROALBUMIN / CREATININE URINE RATIO
Creatinine,U: 117.8 mg/dL
Microalb Creat Ratio: 0.6 mg/g (ref 0.0–30.0)
Microalb, Ur: <0.7 mg/dL (ref 0.0–1.9)

## 2022-02-28 MED ORDER — GLUCAGON 3 MG/DOSE NA POWD: 3.0000 mg | Freq: Once | NASAL | 11 refills | Status: DC | PRN

## 2022-02-28 NOTE — Patient Instructions (Addendum)
Please continue: - Metformin ER 1000 mg a day - Januvia 100 mg  before breakfast  Please decrease: - Tresiba 5-6 units daily - Actos 22.5 mg  before breakfast   Please stop at the lab.  Try to read: "Think like a pancreas".  Please return in 3-4 months.

## 2022-02-28 NOTE — Progress Notes (Unsigned)
Patient ID: Adam Potter, male   DOB: [birth year], 46 y.o.   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 Pt. previously saw Dr. Everardo Potter.  Last visit with me 2 months ago. He is here with his girlfriend who offers part of the history especially related to his medications, diet, weight, activity.  Interim history No increased urination, blurry vision, nausea, chest pain.  He gained 5 pounds since last visit.  Reviewed HbA1c levels: Lab Results  Component Value Date   HGBA1C 8.9 (A) 12/18/2021   HGBA1C 8.5 (A) 10/28/2021   HGBA1C 8.0 (A) 08/26/2021  03/19/2021: HbA1c 6.6%  At last visit, investigation pointed towards LADA, rather than type 2 diabetes: Component     Latest Ref Rng 12/18/2021  Glucose Fasting, POC     70 - 99 mg/dL 937 !   Islet Cell Ab     Neg:<1:1  Negative   ZNT8 Antibodies     <15 U/mL <10   Glutamic Acid Decarb Ab     <5 IU/mL 226 (H)     At last visit he was on: - Metformin ER 1000 >> 1500 >> 1000 mg a day - Glimepiride 4 >> 2 mg before breakfast - decreased 4 days ago because leg pain - Januvia 100 mg  before breakfast - Actos 45 mg  before breakfast  - started in 08/2021. He refused insulin in the past.   We changed to: - Metformin ER 1500 mg a day - Januvia 100 mg  before breakfast - Actos 45 mg  before breakfast  - Tresiba 8 units daily - started 12/2021  Pt was not checking blood sugars at last visit as he did not have a meter at home.  Since last visit, he started on a CGM, checking blood sugars at least 4 times a day:   Lowest: 52 Highest: 400 (overeating, 2 mo ago >> now <300)  - no CKD, last BUN/creatinine:   2021-03-19     Albumin 4.8   3.4-4.8  ALP 35   38-126  ALT 11   0-52  Anion Gap 11.5   6.0-20.0  AST 15   0-39  BUN 13   6-26  CO2 29   22-32  CA-corrected 8.80    8.60-10.30  Calcium 9.6   8.6-10.3  Chloride 104   98-107  Creatinine 0.70   0.60-1.30  eGFR2021 115   >60  Glucose 113   70-99  Potassium 5.5   3.5-5.5  Sodium 139   136-145  Protein, Total 7.4   6.0-8.3  TBIL 0.5   0.3-1.0  Microalbumin Panel   2021-03-19    MA/CR ratio <10.9   0.0-30.0  UCR 64      UMA <0.7       No results found for: "BUN", "CREATININE" He is not on ACE inhibitor/ARB.  - last set of lipids:  2021-03-19     LDL Chol Calc (NIH) 91   0-99  CHOL/HDL 2.2   2.0-4.0  Cholesterol 185   <200  HDLD 84   30-70  LDL Chol Calc (NIH) 91   0-99  NHDL 101   0-129  Triglyceride 49   0-199   No results found for: "CHOL", "HDL", "LDLCALC", "LDLDIRECT", "TRIG", "CHOLHDL" He is not on a statin. He refused these in the past.  - last eye exam was in  2020. No DR reportedly.   - + numbness and tingling in his feet. On Gabapentin - 300 mg prn.  ROS: + see HPI He does have increased urination, but no blurry vision, nausea, chest pain.  Past Medical History:  Diagnosis Date   Asthma    Diabetes mellitus without complication (HCC)    Past Surgical History:  Procedure Laterality Date   KNEE SURGERY Left    Social History   Socioeconomic History   Marital status: Single    Spouse name: Not on file   Number of children: Not on file   Years of education: Not on file   Highest education level: Not on file  Occupational History   Not on file  Tobacco Use   Smoking status: Every Day    Packs/day: 1.00    Years: 24.00    Total pack years: 24.00    Types: Cigarettes   Smokeless tobacco: Never  Vaping Use   Vaping Use: Never used  Substance and Sexual Activity   Alcohol use: Yes    Alcohol/week: 24.0 standard drinks of alcohol    Types: 24 Cans of beer per week   Drug use: Never   Sexual activity: Not on file  Other Topics Concern   Not on file  Social History Narrative   Not on file   Social Determinants of Health   Financial Resource Strain: Not on file   Food Insecurity: Not on file  Transportation Needs: Not on file  Physical Activity: Not on file  Stress: Not on file  Social Connections: Not on file  Intimate Partner Violence: Not on file   Current Outpatient Medications on File Prior to Visit  Medication Sig Dispense Refill   albuterol (VENTOLIN HFA) 108 (90 Base) MCG/ACT inhaler Inhale 2 puffs into the lungs every 6 (six) hours as needed for wheezing or shortness of breath.     budesonide-formoterol (SYMBICORT) 80-4.5 MCG/ACT inhaler Inhale 2 puffs into the lungs 2 (two) times daily. 1 Inhaler 11   Continuous Blood Gluc Sensor (FREESTYLE LIBRE 3 SENSOR) MISC 1 each by Does not apply route every 14 (fourteen) days. 6 each 3   ibuprofen (ADVIL,MOTRIN) 200 MG tablet Take 200 mg by mouth every 6 (six) hours as needed.     insulin degludec (TRESIBA FLEXTOUCH) 100 UNIT/ML FlexTouch Pen Inject 8 Units into the skin daily. 9 mL 3   Insulin Pen Needle 32G X 4 MM MISC Use 1x a day 100 each 3   metFORMIN (GLUCOPHAGE-XR) 500 MG 24 hr tablet Take 3 tablets (1,500 mg total) by mouth daily. 270 tablet 3   pioglitazone (ACTOS) 45 MG tablet Take 1 tablet (45 mg total) by mouth daily. 90 tablet 3   sitaGLIPtin (JANUVIA) 100 MG tablet Take 1 tablet by mouth daily.     No current facility-administered medications on file prior to visit.   No Known Allergies Family History  Problem Relation Age of Onset   Diabetes Neg Hx    PE: BP 100/64 (BP Location: Left Arm, Patient Position: Sitting, Cuff Size: Normal)   Pulse 94   Ht 5\' 6"  (1.676 m)   Wt 124 lb (56.2 kg)   SpO2 99%   BMI 20.01 kg/m  Wt Readings from Last 3 Encounters:  02/28/22 124 lb (56.2 kg)  12/18/21 119 lb 12.8 oz (54.3 kg)  10/28/21 120 lb 12.8 oz (54.8 kg)   Constitutional: thin, in NAD Eyes: no exophthalmos ENT: moist mucous membranes, no masses palpated in neck,  no cervical lymphadenopathy Cardiovascular: tachycardia, RR, No MRG Respiratory: CTA B Musculoskeletal: no  deformities Skin: moist, warm, no rashes Neurological: no tremor with outstretched hands  ASSESSMENT: 1. LADA, insulin-dependent, uncontrolled, with complications - PN  2.  Dyslipidemia  PLAN:  1. Patient with longstanding, uncontrolled diabetes, with poor control, off insulin at last visit as he refused this in the past.  HbA1c was quite high, at 8.9% and we checked his blood sugar in the office and he was 336 after lunch.  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  We could not check him for insulin deficiency due to the very high blood sugar.  I plan to do this when sugars improved.  As of now, he has a diagnosis of LADA, but it may turn out in the future that he has type 1 diabetes. -At last visit, we discussed about the importance of checking blood sugars and I sent a prescription for the freestyle libre CGM to his pharmacy.  I did advise him to stop glimepiride and started long-acting insulin low-dose.  I advised him to increase the dose as needed.  I also advised him to drink less alcohol.  I explained the mechanism by which alcohol can cause or worsen hypoglycemia.  He was drinking approximately 6 beers every night.  However, he was tell me that he was not ready to decrease the amount or quit anytime soon... CGM interpretation: -At today's visit, we reviewed his CGM downloads: It appears that 74% of values are in target range (goal >70%), while 21% are higher than 180 (goal <25%), and 5% are lower than 70 (goal <4%).  The calculated average blood sugar is 140.  The projected HbA1c for the next 3 months (GMI) is 6.7%. -Reviewing the CGM trends, it appears that his sugars are fluctuating usually in the target range, with only occasional spikes after meals especially after dinner.  However, he does have lower blood sugars approximately around 7-9 p.m., and then also rarely in the middle of the night.  He also feels that his sugars are dropping approximately 3 hours  after he takes [place].  This is a consistent trend. -At today's visit, we discussed that his sugars appear to have improved greatly since last visit.  However, due to the lows, I advised him to reduce the [place] dose initially to 60 units, and she may need to reduce it further if he still has lows.  He already reduced the dose of metformin, and will continue with the lower dose.  I also advised him to decrease Actos to half a tablet daily.  The next visit, I plan to stop this.  For now, I recommended to keep Januvia, since this can help [place] control his blood sugars after meals. -We discussed about how to correct low blood sugars and reduce the 15-15 hypoglycemia correction rules.  I advised him to check his blood sugars with his glucometer, rather than the CGM, when correcting low blood sugars. - refuses a referral to nutrition today - I suggested to:  Patient Instructions  Please continue: - Metformin ER 1000 mg a day - Januvia 100 mg  before breakfast  Please decrease: - Tresiba 5-6 units daily - Actos 22.5 mg  before breakfast   Please stop at the lab.  Try to read: "Think like a pancreas".  Please return in 3-4 months.  - we checked his HbA1c: 7.2% (much better) - advised to check sugars at different times of the day -  4x a day, rotating check times - advised for yearly eye exams >> he is not UTD-advised to schedule - will check a foot exam at next visit - at today's visit, we will check his insulin production - return to clinic in 3 months  2.  Dyslipidemia -Reviewed latest lipid panel from 03/2022 and the LDL was above our target of less than 70.  He refused statins in the past. -He is due for another lipid panel -we will check this today Component     Latest Ref Rng 02/28/2022  Sodium     135 - 145 mEq/L 137   Potassium     3.5 - 5.1 mEq/L 4.5   Chloride     96 - 112 mEq/L 101   CO2     19 - 32 mEq/L 28   Glucose     70 - 99 mg/dL 845 (H)   Glucose     65 - 99 mg/dL  364 (H)   BUN     6 - 23 mg/dL 18   Creatinine     6.80 - 1.50 mg/dL 3.21   Total Bilirubin     0.2 - 1.2 mg/dL 0.7   Alkaline Phosphatase     39 - 117 U/L 44   AST     0 - 37 U/L 26   ALT     0 - 53 U/L 17   Total Protein     6.0 - 8.3 g/dL 7.6   Albumin     3.5 - 5.2 g/dL 4.9   GFR     >22.48 mL/min 92.96   Calcium     8.4 - 10.5 mg/dL 9.7   Cholesterol     0 - 200 mg/dL 250 (H)   Triglycerides     0.0 - 149.0 mg/dL 03.7   HDL Cholesterol     >39.00 mg/dL 04.88   VLDL     0.0 - 40.0 mg/dL 89.1   LDL (calc)     0 - 99 mg/dL 694 (H)   Total CHOL/HDL Ratio 2   NonHDL 128.86   Microalb, Ur     0.0 - 1.9 mg/dL <5.0   Creatinine,U     mg/dL 388.8   MICROALB/CREAT RATIO     0.0 - 30.0 mg/g 0.6   C-Peptide     0.80 - 3.85 ng/mL 0.80     C-peptide is at the lower limit of the target range.  As of now, we will keep the diagnosis of LADA. LDL is above target.  I would suggest to start a low-dose statin.  We will check with the patient.  Carlus Pavlov, MD PhD [facility]

## 2022-03-03 LAB — C-PEPTIDE: C-Peptide: 0.8 ng/mL (ref 0.80–3.85)

## 2022-03-03 LAB — GLUCOSE, FASTING: Glucose, Bld: 103 mg/dL — ABNORMAL HIGH (ref 65–99)

## 2022-03-04 ENCOUNTER — Encounter: Payer: Self-pay | Admitting: Internal Medicine

## 2022-04-16 ENCOUNTER — Encounter: Payer: Self-pay | Admitting: Internal Medicine

## 2022-04-16 ENCOUNTER — Telehealth: Payer: Self-pay

## 2022-04-16 NOTE — Telephone Encounter (Signed)
Pt came by to ensure his Adam Potter is connected as he is having some low blood sugar readings. Pt has reduced his [place] to 5 units and is still experiencing lows.

## 2022-04-16 NOTE — Telephone Encounter (Signed)
T, I was able to review the tracings.  He is dropping mostly after dinner and during the night.  We can try to stop [place] completely but keep a very close eye on the blood sugars.  If the sugars still drop, please stop Actos also.  Please let me know how the sugars are doing before the weekend.

## 2022-04-16 NOTE — Telephone Encounter (Signed)
Pt contacted and advised to d/c Tresiba and closely monitor blood sugars. Pt to call back Friday to follow up.

## 2022-04-24 ENCOUNTER — Encounter: Payer: Self-pay | Admitting: Internal Medicine

## 2022-04-29 NOTE — Telephone Encounter (Signed)
Called and spoke with pt and he confirmed the double vision only happened the one time. Pt has since resumed taking his Actos along his Metformin Tresiba (3 units) and Januvia.

## 2022-05-22 NOTE — Progress Notes (Signed)
Patient ID: Adam Potter, male   DOB: 1976/07/06, 46 y.o.   MRN: [id number]  HPI: Adam Potter is a 46 y.o.-year-old male, returning for follow-up for LADA, dx 12/2021, but dx in 2015 as DM2,  insulin-dependent, uncontrolled, with complications (peripheral neuropathy). Pt. previously saw Dr. Loanne Drilling.  Last visit with me 3 months ago.  Interim history No increased urination, nausea, chest pain.   At last visit, he had an episode of visual disturbance 3 days after he tried to stop Actos: he started to feel poorly and mentions that he was seeing several contours of the person in front of him.  He restarted Actos right away.  However, he had another episode 2 weeks after starting this back.  His blood sugars were in the 120s when these episodes happens. He has mm cramps in L calf. He continues to drink beer when he comes home from work.  This is followed by low blood sugars.  Reviewed HbA1c levels: Lab Results  Component Value Date   HGBA1C 7.2 (A) 02/28/2022   HGBA1C 8.9 (A) 12/18/2021   HGBA1C 8.5 (A) 10/28/2021   HGBA1C 8.0 (A) 08/26/2021  03/19/2021: HbA1c 6.6%  Investigation pointed towards LADA, rather than type 2 diabetes: Component     Latest Ref Rng 12/18/2021  Glucose Fasting, POC     70 - 99 mg/dL 313 !   Islet Cell Ab     Neg:<1:1  Negative   ZNT8 Antibodies     <15 U/mL <10   Glutamic Acid Decarb Ab     <5 IU/mL 226 (H)     Glucose     70 - 99 mg/dL 109 (H)   Glucose     65 - 99 mg/dL 103 (H)   C-Peptide     0.80 - 3.85 ng/mL 0.80    Prev. on: - Metformin ER 1000 >> 1500 >> 1000 mg a day - Glimepiride 4 >> 2 mg before breakfast - decreased 4 days ago because leg pain - Januvia 100 mg  before breakfast - Actos 45 mg  before breakfast  - started in 08/2021.  We changed to: - Metformin ER 1500 mg a day >> 500 mg 2x a day - Januvia 100 mg  before breakfast - Actos 45 >> 22.5 >> went back to 45 mg  before breakfast (had visual disturbance off it) - [place] 8 units  daily - started 12/2021 >> 4-5 >> 3 units daily  He checks blood sugars at least 4 times a day with his CGM:  Previously:   Lowest: 52 >> 59. Highest: 400 (overeating) >> 300s.  - no CKD, last BUN/creatinine:  Lab Results  Component Value Date   BUN 18 02/28/2022   CREATININE 0.97 02/28/2022   He is not on ACE inhibitor/ARB.  - + HL; last set of lipids: Lab Results  Component Value Date   CHOL 216 (H) 02/28/2022   HDL 87.60 02/28/2022   LDLCALC 113 (H) 02/28/2022   TRIG 78.0 02/28/2022   CHOLHDL 2 02/28/2022  Prev:  2021-03-19     LDL Chol Calc (NIH) 91   0-99  CHOL/HDL 2.2   2.0-4.0  Cholesterol 185   <200  HDLD 84   30-70  LDL Chol Calc (NIH) 91   0-99  NHDL 101   0-129  Triglyceride 24   0-199   He is not on a statin. He refused these.  - last eye exam was in 2020. No DR reportedly.   - +  numbness and tingling in his feet. On Gabapentin - 300 mg prn.  ROS: + see HPI He does have increased urination, but no blurry vision, nausea, chest pain.  Past Medical History:  Diagnosis Date   Asthma    Diabetes mellitus without complication (HCC)    Past Surgical History:  Procedure Laterality Date   KNEE SURGERY Left    Social History   Socioeconomic History   Marital status: Single    Spouse name: Not on file   Number of children: Not on file   Years of education: Not on file   Highest education level: Not on file  Occupational History   Not on file  Tobacco Use   Smoking status: Every Day    Packs/day: 1.00    Years: 24.00    Total pack years: 24.00    Types: Cigarettes   Smokeless tobacco: Never  Vaping Use   Vaping Use: Never used  Substance and Sexual Activity   Alcohol use: Yes    Alcohol/week: 24.0 standard drinks of alcohol    Types: 24 Cans of beer per week   Drug use: Never   Sexual activity: Not on file  Other Topics Concern   Not on file  Social History Narrative   Not on file   Social Determinants of Health   Financial  Resource Strain: Not on file  Food Insecurity: Not on file  Transportation Needs: Not on file  Physical Activity: Not on file  Stress: Not on file  Social Connections: Not on file  Intimate Partner Violence: Not on file   Current Outpatient Medications on File Prior to Visit  Medication Sig Dispense Refill   albuterol (VENTOLIN HFA) 108 (90 Base) MCG/ACT inhaler Inhale 2 puffs into the lungs every 6 (six) hours as needed for wheezing or shortness of breath.     budesonide-formoterol (SYMBICORT) 80-4.5 MCG/ACT inhaler Inhale 2 puffs into the lungs 2 (two) times daily. 1 Inhaler 11   Continuous Blood Gluc Sensor (FREESTYLE LIBRE 3 SENSOR) MISC 1 each by Does not apply route every 14 (fourteen) days. 6 each 3   Glucagon 3 MG/DOSE POWD Place 3 mg into the nose once as needed for up to 1 dose. 1 each 11   ibuprofen (ADVIL,MOTRIN) 200 MG tablet Take 200 mg by mouth every 6 (six) hours as needed.     insulin degludec (TRESIBA FLEXTOUCH) 100 UNIT/ML FlexTouch Pen Inject 8 Units into the skin daily. 9 mL 3   Insulin Pen Needle 32G X 4 MM MISC Use 1x a day 100 each 3   metFORMIN (GLUCOPHAGE-XR) 500 MG 24 hr tablet Take 3 tablets (1,500 mg total) by mouth daily. 270 tablet 3   pioglitazone (ACTOS) 45 MG tablet Take 1 tablet (45 mg total) by mouth daily. 90 tablet 3   sitaGLIPtin (JANUVIA) 100 MG tablet Take 1 tablet by mouth daily.     No current facility-administered medications on file prior to visit.   No Known Allergies Family History  Problem Relation Age of Onset   Diabetes Neg Hx    PE: BP 100/68 (BP Location: Right Arm, Patient Position: Sitting, Cuff Size: Normal)   Pulse 88   Ht 5\' 6"  (1.676 m)   Wt 123 lb 6.4 oz (56 kg)   SpO2 99%   BMI 19.92 kg/m  Wt Readings from Last 3 Encounters:  05/23/22 123 lb 6.4 oz (56 kg)  02/28/22 124 lb (56.2 kg)  12/18/21 119 lb 12.8 oz (54.3 kg)  Constitutional: thin, in NAD Eyes: no exophthalmos ENT: no masses palpated in neck, no cervical  lymphadenopathy Cardiovascular: RRR, No MRG Respiratory: CTA B Musculoskeletal: no deformities Skin: moist, warm, no rashes Neurological: no tremor with outstretched hands Diabetic Foot Exam - Simple   Simple Foot Form Diabetic Foot exam was performed with the following findings: Yes 05/23/2022 11:44 AM  Visual Inspection No deformities, no ulcerations, no other skin breakdown bilaterally: Yes Sensation Testing Intact to touch and monofilament testing bilaterally: Yes Pulse Check Posterior Tibialis and Dorsalis pulse intact bilaterally: Yes Comments    ASSESSMENT: 1. LADA, insulin-dependent, uncontrolled, with complications - PN  2.  Dyslipidemia  PLAN:  1. Patient with longstanding, uncontrolled, LADA, with poor control, off insulin in the past after he repeatedly refused insulin.  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  Subsequently, we checked a C-peptide, and this was 0.8, low normal.  The above results confirm a diagnosis of LADA. -At last visit, we discussed about his alcohol use.  He was drinking 6 beers every night.  We discussed about the effect of alcohol on the blood sugars and the risk of hypoglycemia.  At that time, I advised him to reduce the Tresiba dose and also halve the Actos dose.  He refused a referral to nutrition at that time. CGM interpretation: -At today's visit, we reviewed his CGM downloads: It appears that 74% of values are in target range (goal >70%), while 26% are higher than 180 (goal <25%), and 0% are lower than 70 (goal <4%).  The calculated average blood sugar is 153.  The projected HbA1c for the next 3 months (GMI) is 7.0%. -Reviewing the CGM trends, sugars are fluctuating in the upper half, with the exception of of lower blood sugars between 6 PM and 11 PM.  Upon questioning, he has frequently larger, higher carb lunches, after which the sugars may increase, up to 300s.  Afterwards, in the evening, he comes home from work  and drinks alcohol after which the sugars drop.  They stabilize overnight but they are still quite fluctuating in the upper half of the target range. -At today's visit, he mentions that he restarted back on the whole Actos dose but he keeps a lower dose of Tresiba due to low blood sugars.  He had visual disturbance after coming off Actos and he restarted it.  However, the visual disturbance also happened 2 weeks after starting back on Actos.  I am not exactly clear why he had the symptoms but looking at his CGM traces from the day he developed the symptoms, his sugars fluctuating widely, in a sawtooth pattern.  At the time he had the symptoms, he just ate a meal containing beans (cards), which was followed by hyperglycemic peak and then a steep drop in blood sugars.  Therefore, it is possible that even though his blood sugars were not quite low, the rapid change in the blood sugars could have caused visual changes.  I doubt that this was related to Actos, but for now, we decided to continue it at the high dose. -History of [place] is quite low.  I am tempted to increase this especially since his sugars are fluctuating higher in the normal range, but we need to eliminate his lows and correct his hyperglycemic peaks  first. -For now, I advised him to eat a snack before starting drinking beer in the afternoon.  As of now, he is drinking the beer, waits until his blood sugars dropped  and then needs a snack.  He does not have regular dinners.  I also advised him to take rapid acting insulin before a meal with many carbs.  I want him to have this at hand, especially with the holidays coming up.  He agrees with the plan.  I advised him how much to take and to inject this 15 minutes before the meal.  We will also continue the rest of the regimen for now. - I suggested to:  Patient Instructions  Please continue: - Metformin ER 500 mg 2x a day - Januvia 100 mg  before breakfast - Tresiba 3 units daily - Actos 45 mg   before breakfast   Add:  - Humalog 2-3 units 15 min before a larger meal  Please return in 3-4 months.  - we checked his HbA1c: 7.1% (lower) - advised to check sugars at different times of the day - 4x a day, rotating check times - advised for yearly eye exams >> he is UTD - return to clinic in 3-4 months  2.  Dyslipidemia - Reviewed latest lipid panel from 02/2022: LDL above goal, the rest the fractions at goal: Lab Results  Component Value Date   CHOL 216 (H) 02/28/2022   HDL 87.60 02/28/2022   LDLCALC 113 (H) 02/28/2022   TRIG 78.0 02/28/2022   CHOLHDL 2 02/28/2022  - he refused statins  - Total time spent for the visit: 40 min, in precharting and completing the note after the appointment, reviewing his  previous labs, evaluations, and treatments, reviewing his symptoms, counseling him about his diet Beatties (please see the discussed topics above), and developing a plan to further treat it; he had a number of questions which I addressed.   Carlus Pavlov, MD PhD [facility] Endocrinology

## 2022-05-23 ENCOUNTER — Encounter: Payer: Self-pay | Admitting: Internal Medicine

## 2022-05-23 ENCOUNTER — Ambulatory Visit: Payer: Managed Care, Other (non HMO) | Admitting: Internal Medicine

## 2022-05-23 VITALS — BP 100/68 | HR 88 | Ht 66.0 in | Wt 123.4 lb

## 2022-05-23 DIAGNOSIS — E139 Other specified diabetes mellitus without complications: Secondary | ICD-10-CM

## 2022-05-23 DIAGNOSIS — E785 Hyperlipidemia, unspecified: Secondary | ICD-10-CM | POA: Diagnosis not present

## 2022-05-23 LAB — POCT GLYCOSYLATED HEMOGLOBIN (HGB A1C): Hemoglobin A1C: 7.1 % — AB (ref 4.0–5.6)

## 2022-05-23 MED ORDER — INSULIN LISPRO (1 UNIT DIAL) 100 UNIT/ML (KWIKPEN): 2.0000 [IU] | PEN_INJECTOR | Freq: Two times a day (BID) | SUBCUTANEOUS | 3 refills | Status: DC

## 2022-05-23 NOTE — Patient Instructions (Addendum)
Please continue: - Metformin ER 500 mg 2x a day - Januvia 100 mg  before breakfast - Tresiba 3 units daily - Actos 45 mg  before breakfast   Add:  - Humalog 2-3 units 15 min before a larger meal  Please return in 3-4 months.

## 2022-08-05 ENCOUNTER — Other Ambulatory Visit: Payer: Self-pay

## 2022-08-05 DIAGNOSIS — E139 Other specified diabetes mellitus without complications: Secondary | ICD-10-CM

## 2022-08-05 MED ORDER — SITAGLIPTIN PHOSPHATE 100 MG PO TABS: 100.0000 mg | ORAL_TABLET | Freq: Every day | ORAL | 0 refills | Status: DC

## 2022-08-22 ENCOUNTER — Encounter: Payer: Self-pay | Admitting: Internal Medicine

## 2022-08-22 ENCOUNTER — Ambulatory Visit: Payer: Managed Care, Other (non HMO) | Admitting: Internal Medicine

## 2022-08-22 VITALS — BP 128/72 | HR 90 | Ht 66.0 in | Wt 123.8 lb

## 2022-08-22 DIAGNOSIS — E785 Hyperlipidemia, unspecified: Secondary | ICD-10-CM | POA: Diagnosis not present

## 2022-08-22 DIAGNOSIS — E139 Other specified diabetes mellitus without complications: Secondary | ICD-10-CM

## 2022-08-22 LAB — POCT GLYCOSYLATED HEMOGLOBIN (HGB A1C): Hemoglobin A1C: 6.9 % — AB (ref 4.0–5.6)

## 2022-08-22 MED ORDER — PIOGLITAZONE HCL 30 MG PO TABS: 45.0000 mg | ORAL_TABLET | Freq: Every day | ORAL | 3 refills | Status: DC

## 2022-08-22 MED ORDER — TRESIBA FLEXTOUCH 100 UNIT/ML ~~LOC~~ SOPN: 4.0000 [IU] | PEN_INJECTOR | Freq: Every day | SUBCUTANEOUS | 3 refills | Status: DC

## 2022-08-22 NOTE — Progress Notes (Signed)
Patient ID: Adam Potter, male   DOB: 1975-10-02, 47 y.o.   MRN: [id number]  HPI: Adam Potter is a 47 y.o.-year-old male, returning for follow-up for LADA, dx 12/2021, but dx in 2015 as DM2,  insulin-dependent, uncontrolled, with complications (peripheral neuropathy). Pt. previously saw Dr. Loanne Drilling.  Last visit with me 3 months ago.  Interim history No increased urination, nausea, chest pain.   He continues to drink beer after home from work.  This is followed by low blood sugars in the pm.   He has his main meal at 1 AM, at work.  Reviewed HbA1c levels: Lab Results  Component Value Date   HGBA1C 7.1 (A) 05/23/2022   HGBA1C 7.2 (A) 02/28/2022   HGBA1C 8.9 (A) 12/18/2021   HGBA1C 8.5 (A) 10/28/2021   HGBA1C 8.0 (A) 08/26/2021  03/19/2021: HbA1c 6.6%  Investigation pointed towards LADA, rather than type 2 diabetes: Component     Latest Ref Rng 12/18/2021  Glucose Fasting, POC     70 - 99 mg/dL 313 !   Islet Cell Ab     Neg:<1:1  Negative   ZNT8 Antibodies     <15 U/mL <10   Glutamic Acid Decarb Ab     <5 IU/mL 226 (H)     Glucose     70 - 99 mg/dL 109 (H)   Glucose     65 - 99 mg/dL 103 (H)   C-Peptide     0.80 - 3.85 ng/mL 0.80    Prev. on: - Metformin ER 1000 >> 1500 >> 1000 mg a day - Glimepiride 4 >> 2 mg before breakfast - decreased 4 days ago because leg pain - Januvia 100 mg  before breakfast - Actos 45 mg  before breakfast  - started in 08/2021.  Currently on: - Metformin ER 1500 mg a day >> 500 mg 2x a day - Januvia 100 mg  before breakfast - Actos 45 >> 22.5 >> went back to 45 mg  before breakfast (had visual disturbance off it) >> 3/4 tab a day - Tresiba 8 units daily - started 12/2021 >> 4-5 >> 3 >> he increased to 6 units daily - Humalog 2-3 units 15 min before a larger meal - rec'd 05/2022 He had an episode of visual disturbance 3 days after he tried to stop Actos: he started to feel poorly and mentions that he was seeing several contours of the person  in front of him.  He restarted Actos right away.  However, he had another episode 2 weeks after starting this back.  His blood sugars were in the 120s when these episodes happened.  He checks blood sugars at least 4 times a day with his CGM:  Previously:  Previously:   Lowest: 52 >> 59 >> 50s. Highest: 400 (overeating) >> 300s >> 400s.  - no CKD, last BUN/creatinine:  Lab Results  Component Value Date   BUN 18 02/28/2022   CREATININE 0.97 02/28/2022   He is not on ACE inhibitor/ARB.  - + HL; last set of lipids: Lab Results  Component Value Date   CHOL 216 (H) 02/28/2022   HDL 87.60 02/28/2022   LDLCALC 113 (H) 02/28/2022   TRIG 78.0 02/28/2022   CHOLHDL 2 02/28/2022  Prev:  2021-03-19     LDL Chol Calc (NIH) 91   0-99  CHOL/HDL 2.2   2.0-4.0  Cholesterol 185   <200  HDLD 84   30-70  LDL Chol Calc (NIH) 91   0-99  NHDL 101   0-129  Triglyceride 49   0-199   He is not on a statin. He refused these.  - last eye exam was in 2020. No DR reportedly.   - + numbness and tingling in his feet. On Gabapentin - 300 mg prn.  Last foot exam 05/23/2022.  ROS: + see HPI  Past Medical History:  Diagnosis Date   Asthma    Diabetes mellitus without complication (HCC)    Past Surgical History:  Procedure Laterality Date   KNEE SURGERY Left    Social History   Socioeconomic History   Marital status: Single    Spouse name: Not on file   Number of children: Not on file   Years of education: Not on file   Highest education level: Not on file  Occupational History   Not on file  Tobacco Use   Smoking status: Every Day    Packs/day: 1.00    Years: 24.00    Total pack years: 24.00    Types: Cigarettes   Smokeless tobacco: Never  Vaping Use   Vaping Use: Never used  Substance and Sexual Activity   Alcohol use: Yes    Alcohol/week: 24.0 standard drinks of alcohol    Types: 24 Cans of beer per week   Drug use: Never   Sexual activity: Not on file  Other Topics  Concern   Not on file  Social History Narrative   Not on file   Social Determinants of Health   Financial Resource Strain: Not on file  Food Insecurity: Not on file  Transportation Needs: Not on file  Physical Activity: Not on file  Stress: Not on file  Social Connections: Not on file  Intimate Partner Violence: Not on file   Current Outpatient Medications on File Prior to Visit  Medication Sig Dispense Refill   albuterol (VENTOLIN HFA) 108 (90 Base) MCG/ACT inhaler Inhale 2 puffs into the lungs every 6 (six) hours as needed for wheezing or shortness of breath.     budesonide-formoterol (SYMBICORT) 80-4.5 MCG/ACT inhaler Inhale 2 puffs into the lungs 2 (two) times daily. 1 Inhaler 11   Continuous Blood Gluc Sensor (FREESTYLE LIBRE 3 SENSOR) MISC 1 each by Does not apply route every 14 (fourteen) days. 6 each 3   Glucagon 3 MG/DOSE POWD Place 3 mg into the nose once as needed for up to 1 dose. 1 each 11   ibuprofen (ADVIL,MOTRIN) 200 MG tablet Take 200 mg by mouth every 6 (six) hours as needed.     insulin degludec (TRESIBA FLEXTOUCH) 100 UNIT/ML FlexTouch Pen Inject 8 Units into the skin daily. 9 mL 3   insulin lispro (HUMALOG KWIKPEN) 100 UNIT/ML KwikPen Inject 2-3 Units into the skin 2 (two) times daily between meals. 15 mL 3   Insulin Pen Needle 32G X 4 MM MISC Use 1x a day 100 each 3   metFORMIN (GLUCOPHAGE-XR) 500 MG 24 hr tablet Take 3 tablets (1,500 mg total) by mouth daily. 270 tablet 3   pioglitazone (ACTOS) 45 MG tablet Take 1 tablet (45 mg total) by mouth daily. 90 tablet 3   sitaGLIPtin (JANUVIA) 100 MG tablet Take 1 tablet (100 mg total) by mouth daily. 90 tablet 0   No current facility-administered medications on file prior to visit.   No Known Allergies Family History  Problem Relation Age of Onset   Diabetes Neg Hx    PE: BP 128/72 (BP Location: Right Arm, Patient Position: Sitting, Cuff Size: Normal)  Pulse 90   Ht 5\' 6"  (1.676 m)   Wt 123 lb 12.8 oz (56.2  kg)   SpO2 97%   BMI 19.98 kg/m  Wt Readings from Last 3 Encounters:  08/22/22 123 lb 12.8 oz (56.2 kg)  05/23/22 123 lb 6.4 oz (56 kg)  02/28/22 124 lb (56.2 kg)   Constitutional: thin, in NAD Eyes: no exophthalmos ENT: no masses palpated in neck, no cervical lymphadenopathy Cardiovascular: RRR, No MRG Respiratory: CTA B Musculoskeletal: no deformities Skin:  no rashes Neurological: no tremor with outstretched hands  ASSESSMENT: 1. LADA, insulin-dependent, uncontrolled, with complications - PN  2.  Dyslipidemia  PLAN:  1. Patient with longstanding, uncontrolled, LADA, with poor control, off insulin in the past, after he repeatedly refused to start it.  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  Subsequently, we checked a C-peptide and this was 0.8, low normal.  The above results confirms a diagnosis of LADA. -He continues to drink alcohol (beer) every day when he comes home from work with subsequent low blood sugars in the afternoon -He refused a referral to nutrition in the past -At last visit, CGM interpretation: -At today's visit, we reviewed his CGM downloads: It appears that 73% of values are in target range (goal >70%), while 25% are higher than 180 (goal <25%), and 2% are lower than 70 (goal <4%).  The calculated average blood sugar is 160.  The projected HbA1c for the next 3 months (GMI) is 6.9%. -Reviewing the CGM trends, sugars are fluctuating, but mostly within the target range except from approximately 4 AM to 1 PM, when they are fluctuating around the upper limit of the target range.  They are dropping after he comes home from work and drinks alcohol, with a nadir at approximately 8 PM.  He eats a light dinner around 5 PM and then goes to work.  He has his main meal around 1 AM. -At last visit, I recommended Humalog 15 minutes before larger meal.  He mentions that he tried to use this but dropped his sugars too low and could not bring it up.   Therefore, as of now, he is using Humalog only for correction for very high blood sugars.  We discussed that this is an acceptable, and he can use 1 to 2 units for this purpose.  He does not use it frequently. -At today's visit he tells me that he decreased the dose of Actos to 3/4 of a tablet.  We can continue with this dose and I called in 30 mg tablets to his pharmacy. -He increased the 03/02/22 dose since last visit from 3 units to 6 units.  This does appear to be excessive for him and I advised him to reduce the dose to 4-5 units to hopefully avoid low blood sugars in the afternoon.  Ultimately, stopping alcohol would be greatly recommended. -To improve his blood sugars at night, after he eats his main meal, I advised him to take both metformin doses with this meal - I suggested to:  Patient Instructions  Please continue: - Januvia 100 mg  before breakfast - Actos 30 mg  before breakfast   Move: - Metformin ER 1000 mg with your 1 am meal.  Decrease: - Tresiba 4-5 units daily  Take 2 units of Humalog at the beginning of a large meal or for correction of a high blood sugar.  Please return in 3-4 months.  - we checked his HbA1c: 6.9% (lower) -  advised to check sugars at different times of the day - 4x a day, rotating check times - advised for yearly eye exams >> he is not UTD - $$ for him - return to clinic in 3-4 months  2.  Dyslipidemia -Reviewed latest lipid panel from 02/2022: LDL above goal, fractions otherwise at goal: Lab Results  Component Value Date   CHOL 216 (H) 02/28/2022   HDL 87.60 02/28/2022   LDLCALC 113 (H) 02/28/2022   TRIG 78.0 02/28/2022   CHOLHDL 2 02/28/2022  -He refused statin  Philemon Kingdom, MD PhD [facility] Endocrinology

## 2022-08-22 NOTE — Patient Instructions (Addendum)
Please continue: - Januvia 100 mg  before breakfast - Actos 30 mg  before breakfast   Move: - Metformin ER 1000 mg with your 1 am meal.  Decrease: - Tresiba 4-5 units daily  Take 2 units of Humalog at the beginning of a large meal or for correction of a high blood sugar.  Please return in 3-4 months.

## 2022-10-03 ENCOUNTER — Other Ambulatory Visit: Payer: Self-pay | Admitting: Internal Medicine

## 2022-10-03 DIAGNOSIS — E139 Other specified diabetes mellitus without complications: Secondary | ICD-10-CM

## 2022-10-03 MED ORDER — METFORMIN HCL ER 500 MG PO TB24: 1500.0000 mg | ORAL_TABLET | Freq: Every day | ORAL | 3 refills | Status: DC

## 2022-10-06 ENCOUNTER — Other Ambulatory Visit: Payer: Self-pay

## 2022-10-06 DIAGNOSIS — E139 Other specified diabetes mellitus without complications: Secondary | ICD-10-CM

## 2022-10-06 MED ORDER — SITAGLIPTIN PHOSPHATE 100 MG PO TABS: 100.0000 mg | ORAL_TABLET | Freq: Every day | ORAL | 2 refills | Status: DC

## 2022-10-23 ENCOUNTER — Telehealth: Payer: Self-pay

## 2022-10-23 MED ORDER — BD SHARPS CONTAINER HOME MISC: 99 refills | Status: AC

## 2022-10-23 NOTE — Telephone Encounter (Signed)
Called on behalf of pt to advise sharps container is needed for disposal of sharps. Requesting a rx.

## 2022-12-08 ENCOUNTER — Other Ambulatory Visit: Payer: Self-pay | Admitting: Internal Medicine

## 2022-12-22 ENCOUNTER — Ambulatory Visit: Payer: Managed Care, Other (non HMO) | Admitting: Internal Medicine

## 2022-12-22 ENCOUNTER — Encounter: Payer: Self-pay | Admitting: Internal Medicine

## 2022-12-22 VITALS — BP 118/82 | HR 70 | Ht 66.0 in | Wt 117.6 lb

## 2022-12-22 DIAGNOSIS — E1365 Other specified diabetes mellitus with hyperglycemia: Secondary | ICD-10-CM | POA: Diagnosis not present

## 2022-12-22 DIAGNOSIS — E119 Type 2 diabetes mellitus without complications: Secondary | ICD-10-CM

## 2022-12-22 DIAGNOSIS — E785 Hyperlipidemia, unspecified: Secondary | ICD-10-CM

## 2022-12-22 DIAGNOSIS — E139 Other specified diabetes mellitus without complications: Secondary | ICD-10-CM

## 2022-12-22 DIAGNOSIS — Z794 Long term (current) use of insulin: Secondary | ICD-10-CM

## 2022-12-22 LAB — POCT GLYCOSYLATED HEMOGLOBIN (HGB A1C): Hemoglobin A1C: 6.8 % — AB (ref 4.0–5.6)

## 2022-12-22 NOTE — Patient Instructions (Signed)
Please continue: - Januvia 100 mg  before breakfast - Actos 30 mg  before breakfast  (try to reduce to 15 mg) - Tresiba 4-5 units daily  Try to start: - Humalog 2 units at the beginning of a large dinner or before a larger lunch  Stop Metformin.  Please return in 3-4 months.

## 2022-12-22 NOTE — Progress Notes (Signed)
Patient ID: Adam Potter, male   DOB: [birth year], 47 y.o.   MRN: [id number]  HPI: Adam Potter is a 47 y.o.-year-old male, returning for follow-up for LADA, dx 12/2021, but dx in 2015 as DM2,  insulin-dependent, uncontrolled, with complications (peripheral neuropathy). Pt. previously saw Dr. Everardo All.  Last visit with me 4 months ago.  He is here with his wife of the history especially related to patient's diet, habits, symptoms, and medications.  Interim history No increased urination, nausea, chest pain.  He also has more fatigue, per wife's report. He continues to drink beer after home from work.  This is followed by low blood sugars in the pm.   He has his main meal at 1 AM, after coming home from work.  Reviewed HbA1c levels: Lab Results  Component Value Date   HGBA1C 6.9 (A) 08/22/2022   HGBA1C 7.1 (A) 05/23/2022   HGBA1C 7.2 (A) 02/28/2022   HGBA1C 8.9 (A) 12/18/2021   HGBA1C 8.5 (A) 10/28/2021   HGBA1C 8.0 (A) 08/26/2021  03/19/2021: HbA1c 6.6%  Prev. on: - Metformin ER 1000 >> 1500 >> 1000 mg a day - Glimepiride 4 >> 2 mg before breakfast - decreased 4 days ago because leg pain - Januvia 100 mg  before breakfast - Actos 45 mg  before breakfast  - started in 08/2021.  At last visit he was on: - Metformin ER 1500 mg a day >> 500 mg 2x a day - Januvia 100 mg  before breakfast - Actos 45 >> 22.5 >> went back to 45 mg  before breakfast (had visual disturbance off it) >> 3/4 tab a day - Tresiba 8 units daily - started 12/2021 >> 4-5 >> 3 >> he increased to 6 units daily - Humalog 2-3 units 15 min before a larger meal - rec'd 05/2022 He had an episode of visual disturbance 3 days after he tried to stop Actos: he started to feel poorly and mentions that he was seeing several contours of the person in front of him.  He restarted Actos right away.  However, he had another episode 2 weeks after starting this back.  His blood sugars were in the 120s when these episodes happened.  I  recommended: - Januvia 100 mg  before breakfast - Actos 30 mg  before breakfast  - Metformin ER 1000 mg with your 1 am meal >> now 500 mg in am and 250 mg daily (he reduced the dose due to weight loss. Adam Potter 4-5 >> now 4 units daily  - Actually not taking!  He checks blood sugars at least 4 times a day with his CGM:  Previously:  Previously:  Lowest: 52 >> 59 >> 50s>> 50s. Highest: 400 (overeating) >> 300s >> 400s >> 300s  - no CKD, last BUN/creatinine:  Lab Results  Component Value Date   BUN 18 02/28/2022   CREATININE 0.97 02/28/2022   He is not on ACE inhibitor/ARB.  - + HL; last set of lipids: Lab Results  Component Value Date   CHOL 216 (H) 02/28/2022   HDL 87.60 02/28/2022   LDLCALC 113 (H) 02/28/2022   TRIG 78.0 02/28/2022   CHOLHDL 2 02/28/2022  Prev:  2021-03-19     LDL Chol Calc (NIH) 91   0-99  CHOL/HDL 2.2   2.0-4.0  Cholesterol 185   <200  HDLD 84   30-70  LDL Chol Calc (NIH) 91   0-99  NHDL 101   0-129  Triglyceride 49   0-199  He is not on a statin. He refused these.  - last eye exam was in 2020. No DR reportedly.   - + numbness and tingling in his feet. On Gabapentin - 300 mg prn.  Last foot exam 05/23/2022.  ROS: + see HPI  Past Medical History:  Diagnosis Date   Asthma    Diabetes mellitus without complication (HCC)    Past Surgical History:  Procedure Laterality Date   KNEE SURGERY Left    Social History   Socioeconomic History   Marital status: Single    Spouse name: Not on file   Number of children: Not on file   Years of education: Not on file   Highest education level: Not on file  Occupational History   Not on file  Tobacco Use   Smoking status: Every Day    Packs/day: 1.00    Years: 24.00    Additional pack years: 0.00    Total pack years: 24.00    Types: Cigarettes   Smokeless tobacco: Never  Vaping Use   Vaping Use: Never used  Substance and Sexual Activity   Alcohol use: Yes    Alcohol/week: 24.0  standard drinks of alcohol    Types: 24 Cans of beer per week   Drug use: Never   Sexual activity: Not on file  Other Topics Concern   Not on file  Social History Narrative   Not on file   Social Determinants of Health   Financial Resource Strain: Not on file  Food Insecurity: Not on file  Transportation Needs: Not on file  Physical Activity: Not on file  Stress: Not on file  Social Connections: Not on file  Intimate Partner Violence: Not on file   Current Outpatient Medications on File Prior to Visit  Medication Sig Dispense Refill   albuterol (VENTOLIN HFA) 108 (90 Base) MCG/ACT inhaler Inhale 2 puffs into the lungs every 6 (six) hours as needed for wheezing or shortness of breath.     BD Sharps Container Home MISC Use as directed to dispose of needles 1 each PRN   budesonide-formoterol (SYMBICORT) 80-4.5 MCG/ACT inhaler Inhale 2 puffs into the lungs 2 (two) times daily. 1 Inhaler 11   Continuous Glucose Sensor (FREESTYLE LIBRE 3 SENSOR) MISC APPLY SENSOR EVERY 14 DAYS 2 each 6   Glucagon 3 MG/DOSE POWD Place 3 mg into the nose once as needed for up to 1 dose. 1 each 11   ibuprofen (ADVIL,MOTRIN) 200 MG tablet Take 200 mg by mouth every 6 (six) hours as needed.     insulin degludec (TRESIBA FLEXTOUCH) 100 UNIT/ML FlexTouch Pen Inject 4-5 Units into the skin daily. 9 mL 3   insulin lispro (HUMALOG KWIKPEN) 100 UNIT/ML KwikPen Inject 2-3 Units into the skin 2 (two) times daily between meals. 15 mL 3   Insulin Pen Needle 32G X 4 MM MISC Use 1x a day 100 each 3   metFORMIN (GLUCOPHAGE-XR) 500 MG 24 hr tablet Take 3 tablets (1,500 mg total) by mouth daily. 270 tablet 3   pioglitazone (ACTOS) 30 MG tablet Take 1.5 tablets (45 mg total) by mouth daily. 90 tablet 3   sitaGLIPtin (JANUVIA) 100 MG tablet Take 1 tablet (100 mg total) by mouth daily. 30 tablet 2   No current facility-administered medications on file prior to visit.   No Known Allergies Family History  Problem Relation Age  of Onset   Diabetes Neg Hx    PE: BP 118/82 (BP Location: Right Arm, Patient Position:  Sitting, Cuff Size: Normal)   Pulse 70   Ht 5\' 6"  (1.676 m)   Wt 117 lb 9.6 oz (53.3 kg)   SpO2 99%   BMI 18.98 kg/m  Wt Readings from Last 3 Encounters:  12/22/22 117 lb 9.6 oz (53.3 kg)  08/22/22 123 lb 12.8 oz (56.2 kg)  05/23/22 123 lb 6.4 oz (56 kg)   Constitutional: thin, in NAD Eyes: no exophthalmos ENT: no masses palpated in neck, no cervical lymphadenopathy Cardiovascular: RRR, No MRG Respiratory: CTA B Musculoskeletal: no deformities Skin:  no rashes Neurological: no tremor with outstretched hands  ASSESSMENT: 1. LADA, insulin-dependent, uncontrolled, with complications - PN  Investigation pointed towards LADA, rather than type 2 diabetes: Component     Latest Ref Rng 12/18/2021  Glucose Fasting, POC     70 - 99 mg/dL 161 !   Islet Cell Ab     Neg:<1:1  Negative   ZNT8 Antibodies     <15 U/mL <10   Glutamic Acid Decarb Ab     <5 IU/mL 226 (H)     Glucose     70 - 99 mg/dL 096 (H)   Glucose     65 - 99 mg/dL 045 (H)   C-Peptide     0.80 - 3.85 ng/mL 0.80    2.  Dyslipidemia  PLAN:  1. Patient with longstanding, uncontrolled, LADA, off insulin in the past after he repeatedly refused to start it.  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  Subsequently, we checked a C-peptide and this was 0.8, low normal.  The above results confirmed LADA.  -He continues to drink alcohol (beer) every day when he comes home from work with subsequent low blood sugars in the afternoon -He refused a referral to nutrition in the past -At last visit, reviewing the CGM trends, sugars were fluctuating, mostly within the target range, except from approximately 4 AM to 1 PM when they were fluctuating around the upper limit of the target range.  They were dropping after he came home from work and drinking alcohol, with a nadir of at approximately 8 PM.  He was eating a  large dinner around 5 PM and then going to work.  His main meal was around 1 AM and discontinued.  At that time, I recommended to take Humalog 15 minutes before a larger meal and he mentions that he try to use this but dropped his sugars too low and could not bring it out.  Therefore, he was using NovoLog only for correction for very high blood sugars.  We discussed that this was acceptable and he could use 1 to 2 units for this purpose.  At that time, he also decrease Actos to three fourths of a tablet.  I called in 30 mg tablets to his pharmacy.  He was taking a higher dose of Tresiba than recommended and I advised him to try to reduce it to hopefully avoid low blood sugars in the afternoon.  Ultimately, stopping alcohol was the best practice going forward.  To improve his sugars at night I also recommended to take metformin after eating his main meal at 1 am. -At last visit, CGM interpretation: -At today's visit, we reviewed his CGM downloads: It appears that 76% of values are in target range (goal >70%), while 23% are higher than 180 (goal <25%), and 1% are lower than 70 (goal <4%).  The calculated average blood sugar is 146.  The projected HbA1c for the  next 3 months (GMI) is 6.8%. -Reviewing the CGM trends, sugars appear to be increasing overnight and then more so after lunch.  They are decreasing in the early afternoon and stay well-controlled until approximately 1 AM. -Upon questioning, he tells me that he reduced the dose of metformin since last visit as he was losing too much weight.  Indeed, 6 pounds since last visit.  My suspicion is that his weight loss could be related to his alcohol intake, but I did advise him to try to stop metformin completely at this point.  He also tells me that he is adjusting his meals so that he does not need to take Humalog.  This could also affect his weight.  I advised him not to reduce the carbs anymore but will actually take some Humalog (low-dose) before larger  meals and more consistently before his midday meal since sugars are higher after this.  I believe that the reason that he came here by without mealtime coverage is the fact that he is taking Januvia so we will continue this.  He is taking a minute dose of Tresiba, 4 units daily, which for now we will continue.  Since he is having some low blood sugars in the afternoon, aside from stopping metformin, I also advised him to try again to reduce Actos dose, although he previously identified his alcohol intake in the afternoon as the main culprit for the low blood sugars.  At this visit, he tells me that these mild lows also happen when he is not drinking. - I suggested to:  Patient Instructions  Please continue: - Januvia 100 mg  before breakfast - Actos 30 mg  before breakfast  (try to reduce to 15 mg) - Tresiba 4-5 units daily  Try to start: - Humalog 2 units at the beginning of a large dinner or before a larger lunch  Stop Metformin.  Please return in 3-4 months.  - we checked his HbA1c: 6.8% (slightly lower) - advised to check sugars at different times of the day - 4x a day, rotating check times - advised for yearly eye exams >> he is not UTD - return to clinic in 3-4 months  2.  Dyslipidemia -Reviewed latest lipid panel from 02/2022: LDL above goal, fraction is otherwise at goal: Lab Results  Component Value Date   CHOL 216 (H) 02/28/2022   HDL 87.60 02/28/2022   LDLCALC 113 (H) 02/28/2022   TRIG 78.0 02/28/2022   CHOLHDL 2 02/28/2022  -He refused statins  Carlus Pavlov, MD PhD [facility] Endocrinology

## 2023-01-13 ENCOUNTER — Other Ambulatory Visit: Payer: Self-pay | Admitting: Internal Medicine

## 2023-02-02 ENCOUNTER — Other Ambulatory Visit: Payer: Self-pay | Admitting: Internal Medicine

## 2023-02-02 ENCOUNTER — Telehealth: Payer: Self-pay | Admitting: Internal Medicine

## 2023-02-02 DIAGNOSIS — E139 Other specified diabetes mellitus without complications: Secondary | ICD-10-CM

## 2023-02-02 MED ORDER — SITAGLIPTIN PHOSPHATE 100 MG PO TABS
100.0000 mg | ORAL_TABLET | Freq: Every day | ORAL | 2 refills | Status: DC
Start: 2023-02-02 — End: 2023-08-03

## 2023-02-02 NOTE — Telephone Encounter (Signed)
MEDICATION:  sitaGLIPtin sitaGLIPtin (JANUVIA) 100 MG tablet  PHARMACY:    [id number] - [place], [place] - [address] (Ph: [phone])    HAS THE PATIENT CONTACTED THEIR PHARMACY?  Yes  IS THIS A 90 DAY SUPPLY : Yes  IS PATIENT OUT OF MEDICATION: No  IF NOT; HOW MUCH IS LEFT: Not sure  LAST APPOINTMENT DATE: @5 /20/2024  NEXT APPOINTMENT DATE:@9 /20/2024  DO WE HAVE YOUR PERMISSION TO LEAVE A DETAILED MESSAGE?: Yes  OTHER COMMENT:  Per patient pharmacy is needing more information  **Let patient know to contact pharmacy at the end of the day to make sure medication is ready. **  ** Please notify patient to allow 48-72 hours to process**  **Encourage patient to contact the pharmacy for refills or they can request refills through [facility]**

## 2023-02-02 NOTE — Telephone Encounter (Signed)
Januvia Rx has been sent to [facility]

## 2023-02-14 ENCOUNTER — Other Ambulatory Visit: Payer: Self-pay | Admitting: Internal Medicine

## 2023-04-24 ENCOUNTER — Ambulatory Visit: Payer: Managed Care, Other (non HMO) | Admitting: Internal Medicine

## 2023-04-24 ENCOUNTER — Encounter: Payer: Self-pay | Admitting: Internal Medicine

## 2023-04-24 VITALS — BP 118/60 | HR 76 | Ht 66.0 in | Wt 118.2 lb

## 2023-04-24 DIAGNOSIS — E785 Hyperlipidemia, unspecified: Secondary | ICD-10-CM

## 2023-04-24 DIAGNOSIS — E139 Other specified diabetes mellitus without complications: Secondary | ICD-10-CM

## 2023-04-24 DIAGNOSIS — Z7984 Long term (current) use of oral hypoglycemic drugs: Secondary | ICD-10-CM

## 2023-04-24 DIAGNOSIS — E1042 Type 1 diabetes mellitus with diabetic polyneuropathy: Secondary | ICD-10-CM | POA: Diagnosis not present

## 2023-04-24 DIAGNOSIS — Z794 Long term (current) use of insulin: Secondary | ICD-10-CM

## 2023-04-24 LAB — COMPREHENSIVE METABOLIC PANEL
ALT: 10 U/L (ref 0–53)
AST: 15 U/L (ref 0–37)
Albumin: 4.4 g/dL (ref 3.5–5.2)
Alkaline Phosphatase: 37 U/L — ABNORMAL LOW (ref 39–117)
BUN: 15 mg/dL (ref 6–23)
CO2: 29 mEq/L (ref 19–32)
Calcium: 9.6 mg/dL (ref 8.4–10.5)
Chloride: 103 mEq/L (ref 96–112)
Creatinine, Ser: 0.75 mg/dL (ref 0.40–1.50)
GFR: 106.6 mL/min (ref >60.00)
Glucose, Bld: 143 mg/dL — ABNORMAL HIGH (ref 70–99)
Potassium: 5 mEq/L (ref 3.5–5.1)
Sodium: 139 mEq/L (ref 135–145)
Total Bilirubin: 0.5 mg/dL (ref 0.2–1.2)
Total Protein: 6.8 g/dL (ref 6.0–8.3)

## 2023-04-24 LAB — LIPID PANEL
Cholesterol: 214 mg/dL — ABNORMAL HIGH (ref 0–200)
HDL: 85.9 mg/dL (ref >39.00)
LDL Cholesterol: 119 mg/dL — ABNORMAL HIGH (ref 0–99)
NonHDL: 128.29
Total CHOL/HDL Ratio: 2
Triglycerides: 48 mg/dL (ref 0.0–149.0)
VLDL: 9.6 mg/dL (ref 0.0–40.0)

## 2023-04-24 LAB — MICROALBUMIN / CREATININE URINE RATIO
Creatinine,U: 64 mg/dL
Microalb Creat Ratio: 1.7 mg/g (ref 0.0–30.0)
Microalb, Ur: 1.1 mg/dL (ref 0.0–1.9)

## 2023-04-24 LAB — HEMOGLOBIN A1C: Hemoglobin A1C: 7.3

## 2023-04-24 MED ORDER — METFORMIN HCL 500 MG PO TABS
250.0000 mg | ORAL_TABLET | Freq: Two times a day (BID) | ORAL | Status: DC
Start: 2023-04-24 — End: 2023-08-27

## 2023-04-24 NOTE — Patient Instructions (Addendum)
Please continue: - Metformin 250 mg 2x a day with meals - Januvia 100 mg  before breakfast - Actos 30 mg  before breakfast  (try 15 mg daily) - Tresiba 4-5 units daily  Use: - Humalog 2 units at the beginning of a large dinner or before a larger lunch  Please return in 6 months.

## 2023-04-24 NOTE — Progress Notes (Signed)
Patient ID: Adam Potter, male   DOB: 1976-07-31, 47 y.o.   MRN: [id number]  HPI: Adam Potter is a 47 y.o.-year-old male, returning for follow-up for LADA, dx 12/2021, but dx in 2015 as DM2,  insulin-dependent, uncontrolled, with complications (peripheral neuropathy). Pt. previously saw Dr. Everardo All.  Last visit with me 4 months ago.    Interim history No increased urination, nausea, chest pain. He has occasional blurry vision. He continues to drink beer in the afternoon. He is coming home from work: 10-11 pm (works a shift):  Reviewed HbA1c levels: Lab Results  Component Value Date   HGBA1C 6.8 (A) 12/22/2022   HGBA1C 6.9 (A) 08/22/2022   HGBA1C 7.1 (A) 05/23/2022   HGBA1C 7.2 (A) 02/28/2022   HGBA1C 8.9 (A) 12/18/2021   HGBA1C 8.5 (A) 10/28/2021   HGBA1C 8.0 (A) 08/26/2021  03/19/2021: HbA1c 6.6%  Prev. on: - Metformin ER 1000 >> 1500 >> 1000 mg a day - Glimepiride 4 >> 2 mg before breakfast - decreased 4 days ago because leg pain - Januvia 100 mg  before breakfast - Actos 45 mg  before breakfast  - started in 08/2021.  Then on: - Metformin ER 1500 mg a day >> 500 mg 2x a day - Januvia 100 mg  before breakfast - Actos 45 >> 22.5 >> went back to 45 mg  before breakfast (had visual disturbance off it) >> 3/4 tab a day - Tresiba 8 units daily - started 12/2021 >> 4-5 >> 3 >> he increased to 6 units daily - Humalog 2-3 units 15 min before a larger meal - rec'd 05/2022 He had an episode of visual disturbance 3 days after he tried to stop Actos: he started to feel poorly and mentions that he was seeing several contours of the person in front of him.  He restarted Actos right away.  However, he had another episode 2 weeks after starting this back.  His blood sugars were in the 120s when these episodes happened.  I recommended: - Januvia 100 mg  before breakfast - Actos 30 mg  before breakfast  - >> did not stop  12/2022 >> now 250 mg 2x a day - Tresiba 4-5 >> now 4 units daily -  Humalog 2 units at the beginning of a large meal or for correction of a high blood sugar  >> did not restart 12/2022 -only takes this rarely, for correction, not before meals  He checks blood sugars at least 4 times a day with his CGM:  Previously:  Previously:  Lowest:  50s>> 50s >> 54. Highest: 400s >> 300s >> 300.  - no CKD, last BUN/creatinine:  Lab Results  Component Value Date   BUN 18 02/28/2022   CREATININE 0.97 02/28/2022   Lab Results  Component Value Date   MICRALBCREAT 0.6 02/28/2022  He is not on ACE inhibitor/ARB.  - + HL; last set of lipids: Lab Results  Component Value Date   CHOL 216 (H) 02/28/2022   HDL 87.60 02/28/2022   LDLCALC 113 (H) 02/28/2022   TRIG 78.0 02/28/2022   CHOLHDL 2 02/28/2022  He is not on a statin. He refused these.  - last eye exam was in 2020. No DR reportedly.   - + numbness and tingling in his feet. On Gabapentin - 300 mg prn.  Last foot exam 05/23/2022.  ROS: + see HPI  Past Medical History:  Diagnosis Date   Asthma    Diabetes mellitus without complication (HCC)  Past Surgical History:  Procedure Laterality Date   KNEE SURGERY Left    Social History   Socioeconomic History   Marital status: Single    Spouse name: Not on file   Number of children: Not on file   Years of education: Not on file   Highest education level: Not on file  Occupational History   Not on file  Tobacco Use   Smoking status: Every Day    Current packs/day: 1.00    Average packs/day: 1 pack/day for 24.0 years (24.0 ttl pk-yrs)    Types: Cigarettes   Smokeless tobacco: Never  Vaping Use   Vaping status: Never Used  Substance and Sexual Activity   Alcohol use: Yes    Alcohol/week: 24.0 standard drinks of alcohol    Types: 24 Cans of beer per week   Drug use: Never   Sexual activity: Not on file  Other Topics Concern   Not on file  Social History Narrative   Not on file   Social Determinants of Health   Financial Resource  Strain: Not on file  Food Insecurity: Not on file  Transportation Needs: Not on file  Physical Activity: Not on file  Stress: Not on file  Social Connections: Not on file  Intimate Partner Violence: Not on file   Current Outpatient Medications on File Prior to Visit  Medication Sig Dispense Refill   albuterol (VENTOLIN HFA) 108 (90 Base) MCG/ACT inhaler Inhale 2 puffs into the lungs every 6 (six) hours as needed for wheezing or shortness of breath.     BD PEN NEEDLE NANO 2ND GEN 32G X 4 MM MISC USE 1 ONCE A DAY 100 each 3   BD Shar[employer] Home MISC Use as directed to dispose of needles 1 each PRN   budesonide-formoterol (SYMBICORT) 80-4.5 MCG/ACT inhaler Inhale 2 puffs into the lungs 2 (two) times daily. 1 Inhaler 11   Continuous Glucose Sensor (FREESTYLE LIBRE 3 SENSOR) MISC APPLY SENSOR EVERY 14 DAYS 2 each 6   Glucagon 3 MG/DOSE POWD Place 3 mg into the nose once as needed for up to 1 dose. 1 each 11   ibuprofen (ADVIL,MOTRIN) 200 MG tablet Take 200 mg by mouth every 6 (six) hours as needed.     insulin degludec (TRESIBA FLEXTOUCH) 100 UNIT/ML FlexTouch Pen INJECT 8 UNITS INTO THE SKIN DAILY. 15 mL 3   insulin lispro (HUMALOG KWIKPEN) 100 UNIT/ML KwikPen Inject 2-3 Units into the skin 2 (two) times daily between meals. (Patient not taking: Reported on 12/22/2022) 15 mL 3   pioglitazone (ACTOS) 30 MG tablet Take 1.5 tablets (45 mg total) by mouth daily. 90 tablet 3   sitaGLIPtin (JANUVIA) 100 MG tablet Take 1 tablet (100 mg total) by mouth daily. 30 tablet 2   No current facility-administered medications on file prior to visit.   No Known Allergies Family History  Problem Relation Age of Onset   Diabetes Neg Hx    PE: BP 118/60   Pulse 76   Ht 5\' 6"  (1.676 m)   Wt 118 lb 3.2 oz (53.6 kg)   SpO2 99%   BMI 19.08 kg/m  Wt Readings from Last 3 Encounters:  04/24/23 118 lb 3.2 oz (53.6 kg)  12/22/22 117 lb 9.6 oz (53.3 kg)  08/22/22 123 lb 12.8 oz (56.2 kg)    Constitutional: thin, in NAD Eyes: no exophthalmos ENT: no masses palpated in neck, no cervical lymphadenopathy Cardiovascular: RRR, No MRG Respiratory: CTA B Musculoskeletal: no deformities Skin:  no rashes Neurological: no tremor with outstretched hands Diabetic Foot Exam - Simple   Simple Foot Form Diabetic Foot exam was performed with the following findings: Yes 04/24/2023 11:23 AM  Visual Inspection No deformities, no ulcerations, no other skin breakdown bilaterally: Yes Sensation Testing Intact to touch and monofilament testing bilaterally: Yes Pulse Check Posterior Tibialis and Dorsalis pulse intact bilaterally: Yes Comments    ASSESSMENT: 1. LADA, insulin-dependent, uncontrolled, with complications - PN  Investigation pointed towards LADA, rather than type 2 diabetes: Component     Latest Ref Rng 12/18/2021  Glucose Fasting, POC     70 - 99 mg/dL 102 !   Islet Cell Ab     Neg:<1:1  Negative   ZNT8 Antibodies     <15 U/mL <10   Glutamic Acid Decarb Ab     <5 IU/mL 226 (H)     Glucose     70 - 99 mg/dL 725 (H)   Glucose     65 - 99 mg/dL 366 (H)   C-Peptide     0.80 - 3.85 ng/mL 0.80    2.  Dyslipidemia  PLAN:  1. Patient with longstanding, uncontrolled, LADA, of insulin in the past after he repeatedly refused to start it.  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  Subsequently, we checked a C-peptide and this was 0.8, low normal.  The above results confirmed LADA. -Diabetes control is hindered by the fact that he continues to drink alcohol (beer) every day when he comes home from work with subsequent low blood sugars in the afternoon/evening.  We did discuss about trying to stop alcohol, especially in the setting of his weight loss.  I advised him to stop metformin at last visit for the same reason.  At that time he was explaining that he was trying to adjust his meals so that he did not have to take Humalog anymore.  I explained  that this could affect his weight and I advised him not to reduce the carbs anymore but to take some Humalog (low-dose-for example 2 units) for meals.  He is already using a very low dose of Tresiba, 4-5 units which we continued.  at last visit I advised him to try to decrease the Actos dose to avoid further hypoglycemia especially as he mentioned that, he was having mild lows in the afternoon even without drinking. - no weight loss since last OV - gained 1 lb back -He refused a referral to nutrition in the past. CGM interpretation: -At today's visit, we reviewed his CGM downloads: It appears that 78% of values are in target range (goal >70%), while 21% are higher than 180 (goal <25%), and 1% are lower than 70 (goal <4%).  The calculated average blood sugar is 149.  The projected HbA1c for the next 3 months (GMI) is 6.9%. -Reviewing the CGM trends, sugars appear to be higher but still within target range in the second half of the night and first half of the day, but dropping to the lower half of the target interval and sometimes even lower than 70 before midnight. -Upon questioning, at today's visit he tells me that he did not start the metformin, only reduced the dose to 250 mg daily.  He is cutting a metformin extended release tablet in half.  I explained that in this case, it will not be extended release, but instant release.  For the next refill, we will need to send a regular metformin tablets and  we can continue the dose of 250 mg twice a day for now.  He also tells me that he is taking a 4 unit dose of [place] daily, which we can continue.  He did not reduce the Actos dose as advised at last visit.  He still takes 30 mg before breakfast.  Also, he did not start Humalog before each meal, which I suggested in an effort to allow him to eat a little bit more with meals due to the weight loss.  He is already taking this rarely, for correction. -At today's visit, he has much less lows compared to before.   For now, we discussed about possibly trying to reduce the Actos, but I did not suggest other changes in his regimen. - I suggested to:  Patient Instructions  Please continue: - Metformin 250 mg 2x a day with meals - Januvia 100 mg  before breakfast - Actos 30 mg  before breakfast  (try 15 mg daily) - Tresiba 4-5 units daily  Use: - Humalog 2 units at the beginning of a large dinner or before a larger lunch  Please return in 6 months.  - we checked his HbA1c: 7.3% (higher)  - advised to check sugars at different times of the day - 4x a day, rotating check times - advised for yearly eye exams >> he is not UTD 2/2 $$ - will check annual labs today - return to clinic in 3-4 months  2.  Dyslipidemia -Reviewed latest lipid panel from 02/2022: LDL above target, fractions otherwise at goal: Lab Results  Component Value Date   CHOL 216 (H) 02/28/2022   HDL 87.60 02/28/2022   LDLCALC 113 (H) 02/28/2022   TRIG 78.0 02/28/2022   CHOLHDL 2 02/28/2022  -He refused statins -Will check another panel today  Component     Latest Ref Rng 04/24/2023  Sodium     135 - 145 mEq/L 139   Potassium     3.5 - 5.1 mEq/L 5.0   Chloride     96 - 112 mEq/L 103   CO2     19 - 32 mEq/L 29   Glucose     70 - 99 mg/dL 409 (H)   BUN     6 - 23 mg/dL 15   Creatinine     8.11 - 1.50 mg/dL 9.14   Total Bilirubin     0.2 - 1.2 mg/dL 0.5   Alkaline Phosphatase     39 - 117 U/L 37 (L)   AST     0 - 37 U/L 15   ALT     0 - 53 U/L 10   Total Protein     6.0 - 8.3 g/dL 6.8   Albumin     3.5 - 5.2 g/dL 4.4   GFR     >78.29 mL/min 106.60   Calcium     8.4 - 10.5 mg/dL 9.6   Cholesterol     0 - 200 mg/dL 562 (H)   Triglycerides     0.0 - 149.0 mg/dL 13.0   HDL Cholesterol     >39.00 mg/dL 86.57   VLDL     0.0 - 40.0 mg/dL 9.6   LDL (calc)     0 - 99 mg/dL 846 (H)   Total CHOL/HDL Ratio 2   NonHDL 128.29   Microalb, Ur     0.0 - 1.9 mg/dL 1.1   Creatinine,U     mg/dL 96.2    MICROALB/CREAT RATIO  0.0 - 30.0 mg/g 1.7   Labs are mostly at goal with the exception of a high LDL.  He refused statins but I will check with him if he wants to start ezetimibe.  Carlus Pavlov, MD PhD [facility] Endocrinology

## 2023-06-20 ENCOUNTER — Other Ambulatory Visit: Payer: Self-pay | Admitting: Internal Medicine

## 2023-06-22 NOTE — Telephone Encounter (Signed)
Freestyle Libre 3 plus sensor refill request complete

## 2023-07-03 ENCOUNTER — Other Ambulatory Visit: Payer: Self-pay | Admitting: Internal Medicine

## 2023-08-02 ENCOUNTER — Other Ambulatory Visit: Payer: Self-pay | Admitting: Internal Medicine

## 2023-08-02 DIAGNOSIS — E139 Other specified diabetes mellitus without complications: Secondary | ICD-10-CM

## 2023-08-03 NOTE — Telephone Encounter (Signed)
Januvia refill request complete

## 2023-08-27 ENCOUNTER — Ambulatory Visit: Payer: Managed Care, Other (non HMO) | Admitting: Internal Medicine

## 2023-08-27 ENCOUNTER — Encounter: Payer: Self-pay | Admitting: Internal Medicine

## 2023-08-27 VITALS — BP 120/70 | HR 84 | Ht 66.0 in | Wt 119.4 lb

## 2023-08-27 DIAGNOSIS — E1065 Type 1 diabetes mellitus with hyperglycemia: Secondary | ICD-10-CM

## 2023-08-27 DIAGNOSIS — E785 Hyperlipidemia, unspecified: Secondary | ICD-10-CM | POA: Diagnosis not present

## 2023-08-27 DIAGNOSIS — E1042 Type 1 diabetes mellitus with diabetic polyneuropathy: Secondary | ICD-10-CM | POA: Diagnosis not present

## 2023-08-27 DIAGNOSIS — E139 Other specified diabetes mellitus without complications: Secondary | ICD-10-CM

## 2023-08-27 LAB — POCT GLYCOSYLATED HEMOGLOBIN (HGB A1C): Hemoglobin A1C: 7.4 % — AB (ref 4.0–5.6)

## 2023-08-27 MED ORDER — INSULIN LISPRO (1 UNIT DIAL) 100 UNIT/ML (KWIKPEN): 2.0000 [IU] | PEN_INJECTOR | Freq: Three times a day (TID) | SUBCUTANEOUS | 3 refills | Status: AC

## 2023-08-27 MED ORDER — METFORMIN HCL 500 MG PO TABS: 250.0000 mg | ORAL_TABLET | Freq: Every day | ORAL | Status: DC

## 2023-08-27 MED ORDER — PIOGLITAZONE HCL 30 MG PO TABS: ORAL_TABLET | ORAL | 3 refills | Status: DC

## 2023-08-27 MED ORDER — TRESIBA FLEXTOUCH 100 UNIT/ML ~~LOC~~ SOPN: 4.0000 [IU] | PEN_INJECTOR | Freq: Every day | SUBCUTANEOUS | Status: DC

## 2023-08-27 NOTE — Patient Instructions (Signed)
Please continue: - Metformin 250 mg 2x a day with meals - Januvia 100 mg  before breakfast - Actos 30 mg  before breakfast  - Tresiba 4 units daily  Add: - Humalog 2-3 units before your main meals  Please return in 4-6 months.

## 2023-08-27 NOTE — Progress Notes (Signed)
I patient ID: Adam Potter, male   DOB: April 09, 1976, 48 y.o.   MRN: [id number]  HPI: Adam Potter is a 48 y.o.-year-old male, returning for follow-up for LADA, dx 12/2021, but dx in 2015 as DM2,  insulin-dependent, uncontrolled, with complications (peripheral neuropathy). Pt. previously saw Dr. Everardo All.  Last visit with me 4 months ago.    Interim history No increased urination, nausea, chest pain.  He is coming home from work: 10-11 pm (works a shift).  Eats late: 12-1 am.  Reviewed HbA1c levels: Lab Results  Component Value Date   HGBA1C 7.3 04/24/2023   HGBA1C 6.8 (A) 12/22/2022   HGBA1C 6.9 (A) 08/22/2022   HGBA1C 7.1 (A) 05/23/2022   HGBA1C 7.2 (A) 02/28/2022   HGBA1C 8.9 (A) 12/18/2021   HGBA1C 8.5 (A) 10/28/2021   HGBA1C 8.0 (A) 08/26/2021  03/19/2021: HbA1c 6.6%  Prev. on: - Metformin ER 1000 >> 1500 >> 1000 mg a day - Glimepiride 4 >> 2 mg before breakfast - decreased 4 days ago because leg pain - Januvia 100 mg  before breakfast - Actos 45 mg  before breakfast  - started in 08/2021.  Then on: - Metformin ER 1500 mg a day >> 500 mg 2x a day - Januvia 100 mg  before breakfast - Actos 45 >> 22.5 >> went back to 45 mg  before breakfast (had visual disturbance off it) >> 3/4 tab a day - Tresiba 8 units daily - started 12/2021 >> 4-5 >> 3 >> he increased to 6 units daily - Humalog 2-3 units 15 min before a larger meal - rec'd 05/2022 He had an episode of visual disturbance 3 days after he tried to stop Actos: he started to feel poorly and mentions that he was seeing several contours of the person in front of him.  He restarted Actos right away.  However, he had another episode 2 weeks after starting this back.  His blood sugars were in the 120s when these episodes happened.  He is currently on: - Januvia 100 mg  before breakfast - Actos 30 mg  before breakfast (did not decrease to 15) - >> did not stop  12/2022 >> 250 mg 2x a day >> 1x a day - Tresiba 4-5 >> 4 units daily  in pm - Humalog 2 units at the beginning of a large meal or for correction of a high blood sugar  >> did not restart 12/2022 >> again advised to start >> only for correction, 2 units  He checks blood sugars at least 4 times a day with his CGM:  Previously:  Previously:   Lowest:  50s>> 50s >> 54. Highest: 400s >> 300s >> 300.  - no CKD, last BUN/creatinine:  Lab Results  Component Value Date   BUN 15 04/24/2023   BUN 18 02/28/2022   CREATININE 0.75 04/24/2023   CREATININE 0.97 02/28/2022   Lab Results  Component Value Date   MICRALBCREAT 1.7 04/24/2023   MICRALBCREAT 0.6 02/28/2022  He is not on ACE inhibitor/ARB.  - + HL; last set of lipids: Lab Results  Component Value Date   CHOL 214 (H) 04/24/2023   HDL 85.90 04/24/2023   LDLCALC 119 (H) 04/24/2023   TRIG 48.0 04/24/2023   CHOLHDL 2 04/24/2023  He is not on a statin. He refused these.  - last eye exam was in 2020. No DR reportedly.   - + numbness and tingling in his feet. On Gabapentin - 300 mg prn.  Last foot  exam 04/24/2023.  ROS: + see HPI  Past Medical History:  Diagnosis Date   Asthma    Diabetes mellitus without complication (HCC)    Past Surgical History:  Procedure Laterality Date   KNEE SURGERY Left    Social History   Socioeconomic History   Marital status: Single    Spouse name: Not on file   Number of children: Not on file   Years of education: Not on file   Highest education level: Not on file  Occupational History   Not on file  Tobacco Use   Smoking status: Every Day    Current packs/day: 1.00    Average packs/day: 1 pack/day for 24.0 years (24.0 ttl pk-yrs)    Types: Cigarettes   Smokeless tobacco: Never  Vaping Use   Vaping status: Never Used  Substance and Sexual Activity   Alcohol use: Yes    Alcohol/week: 24.0 standard drinks of alcohol    Types: 24 Cans of beer per week   Drug use: Never   Sexual activity: Not on file  Other Topics Concern   Not on file  Social  History Narrative   Not on file   Social Drivers of Health   Financial Resource Strain: Not on file  Food Insecurity: Not on file  Transportation Needs: Not on file  Physical Activity: Not on file  Stress: Not on file  Social Connections: Not on file  Intimate Partner Violence: Not on file   Current Outpatient Medications on File Prior to Visit  Medication Sig Dispense Refill   albuterol (VENTOLIN HFA) 108 (90 Base) MCG/ACT inhaler Inhale 2 puffs into the lungs every 6 (six) hours as needed for wheezing or shortness of breath.     BD PEN NEEDLE NANO 2ND GEN 32G X 4 MM MISC USE 1 ONCE A DAY 100 each 3   BD [employer] Home MISC Use as directed to dispose of needles 1 each PRN   budesonide-formoterol (SYMBICORT) 80-4.5 MCG/ACT inhaler Inhale 2 puffs into the lungs 2 (two) times daily. (Patient not taking: Reported on 04/24/2023) 1 Inhaler 11   Continuous Glucose Sensor (FREESTYLE LIBRE 3 PLUS SENSOR) MISC APPLY SENSOR EVERY 14 DAYS 2 each 6   Glucagon 3 MG/DOSE POWD Place 3 mg into the nose once as needed for up to 1 dose. 1 each 11   ibuprofen (ADVIL,MOTRIN) 200 MG tablet Take 200 mg by mouth every 6 (six) hours as needed.     insulin degludec (TRESIBA FLEXTOUCH) 100 UNIT/ML FlexTouch Pen INJECT 8 UNITS INTO THE SKIN DAILY. (Patient taking differently: 4 Units.) 15 mL 3   insulin lispro (HUMALOG KWIKPEN) 100 UNIT/ML KwikPen Inject 2-3 Units into the skin 2 (two) times daily between meals. 15 mL 3   JANUVIA 100 MG tablet TAKE 1 TABLET BY MOUTH EVERY DAY 30 tablet 2   metFORMIN (GLUCOPHAGE) 500 MG tablet Take 0.5 tablets (250 mg total) by mouth 2 (two) times daily with a meal.     pioglitazone (ACTOS) 30 MG tablet Take 1/2 tablet daily 90 tablet 0   No current facility-administered medications on file prior to visit.   No Known Allergies Family History  Problem Relation Age of Onset   Diabetes Neg Hx    PE: BP 120/70   Pulse 84   Ht 5\' 6"  (1.676 m)   Wt 119 lb 6.4 oz (54.2 kg)    SpO2 97%   BMI 19.27 kg/m  Wt Readings from Last 3 Encounters:  08/27/23 119  lb 6.4 oz (54.2 kg)  04/24/23 118 lb 3.2 oz (53.6 kg)  12/22/22 117 lb 9.6 oz (53.3 kg)   Constitutional: thin, in NAD Eyes: no exophthalmos ENT: no masses palpated in neck, no cervical lymphadenopathy Cardiovascular: RRR, No MRG Respiratory: CTA B Musculoskeletal: no deformities Skin:  no rashes Neurological: no tremor with outstretched hands  ASSESSMENT: 1. LADA, insulin-dependent, uncontrolled, with complications - PN  Investigation pointed towards LADA, rather than type 2 diabetes: Component     Latest Ref Rng 12/18/2021  Glucose Fasting, POC     70 - 99 mg/dL 536 !   Islet Cell Ab     Neg:<1:1  Negative   ZNT8 Antibodies     <15 U/mL <10   Glutamic Acid Decarb Ab     <5 IU/mL 226 (H)     Glucose     70 - 99 mg/dL 644 (H)   Glucose     65 - 99 mg/dL 034 (H)   C-Peptide     0.80 - 3.85 ng/mL 0.80    2.  Dyslipidemia  PLAN:  1. Patient with longstanding, uncontrolled, LADA, off insulin in the past after he repeatedly refused to start it.  His GAD antibodies were elevated.  C-peptide was low.  We discussed that he absolutely needs to stay on insulin. -Diabetes control is hindered by the fact that he continues to drink alcohol (beer) every day when he comes home from work with subsequent low blood sugars in the afternoon/evening.  We did discuss about trying to stop alcohol, especially in the setting of his weight loss.  I advised him to stop metformin at last visit for the same reason.  He tends to use less insulin than recommended usually. He refused a referral to nutrition in the past. -HbA1c was higher, at 7.3%.  Sugars are higher but still within the target range in the second half of the night and first half of the day but dropping to the lower half of the target interval and sometimes even lower than 70 before midnight.  At last visit he was still on metformin, a low-dose.  He was  cutting the metformin ER tablet in half.  I explained that in that case it would not be extended release, with instant release.  He was also using a low dose of Tresiba, 40 units daily, which I advised him to continue.  He did not reduce the Actos dose as advised at the previous visit so I again advised him to try to take only 15 mg rather than 30 daily.  Also, at last visit he did not start Humalog before each meal which I suggested in an effort to allow him to eat a little bit more due to his weight loss.  He was taking Humalog sporadically, only for correction.  I advised him to use 2 units at the beginning of a large meal.  CGM interpretation: -At today's visit, we reviewed his CGM downloads: It appears that 66% of values are in target range (goal >70%), while 33% are higher than 180 (goal <25%), and 1% are lower than 70 (goal <4%).  The calculated average blood sugar is 168.  The projected HbA1c for the next 3 months (GMI) is 7.3%. -Reviewing the CGM trends, sugars are higher than at last visit, usually decreasing within the target range in the afternoon, but then increasing abruptly after each meal around 12 AM they continue to increase overnight, peaking around 7 to 8 AM.  At that  time, he sometimes takes 2 units of Humalog for correction which usually will bring the sugars down.  Is not taking Humalog before meals.  We discussed today's visit that he does need to take Humalog 15 min before a meal to avoid postprandial hyperglycemia.  If he continues to take it only for correction, this induces variability in his blood sugars, with hyperglycemia right after meals and possible hypoglycemia later. -Since last visit, he did not decrease the dose of Actos from 30 to 15 mg daily. Will continue this for now as he is very reticent to reduce the dose.  He did, however, reduce the dose of metformin, to only 250 mg daily.  Will also continue this dose. - I suggested to:  Patient Instructions  Please continue: -  Metformin 250 mg 2x a day with meals - Januvia 100 mg  before breakfast - Actos 30 mg  before breakfast  - Tresiba 4 units daily  Add: - Humalog 2-3 units before your main meals  Please return in 4-6 months.  - we checked his HbA1c: 7.4% (slightly higher) - advised to check sugars at different times of the day - 4x a day, rotating check times - advised for yearly eye exams >> he is not UTD 2/2 cost - return to clinic in 4-6  months  2.  Dyslipidemia -Latest lipid panel was reviewed from 04/2023: LDL above target, otherwise fractions at goal Lab Results  Component Value Date   CHOL 214 (H) 04/24/2023   HDL 85.90 04/24/2023   LDLCALC 119 (H) 04/24/2023   TRIG 48.0 04/24/2023   CHOLHDL 2 04/24/2023  -He refused statins.  At last visit, I recommended ezetimibe.  He did not start this.  Carlus Pavlov, MD PhD [facility] Endocrinology

## 2023-09-09 ENCOUNTER — Encounter: Payer: Self-pay | Admitting: Internal Medicine

## 2023-09-09 MED ORDER — BD PEN NEEDLE NANO 2ND GEN 32G X 4 MM MISC: 4 refills | Status: AC

## 2023-09-10 ENCOUNTER — Encounter: Payer: Self-pay | Admitting: Physician Assistant

## 2023-09-10 ENCOUNTER — Other Ambulatory Visit (INDEPENDENT_AMBULATORY_CARE_PROVIDER_SITE_OTHER): Payer: Managed Care, Other (non HMO)

## 2023-09-10 ENCOUNTER — Ambulatory Visit: Payer: Managed Care, Other (non HMO) | Admitting: Physician Assistant

## 2023-09-10 DIAGNOSIS — G8929 Other chronic pain: Secondary | ICD-10-CM | POA: Diagnosis not present

## 2023-09-10 DIAGNOSIS — M5440 Lumbago with sciatica, unspecified side: Secondary | ICD-10-CM

## 2023-09-10 DIAGNOSIS — M545 Low back pain, unspecified: Secondary | ICD-10-CM | POA: Diagnosis not present

## 2023-09-10 NOTE — Progress Notes (Addendum)
 Office Visit Note   Patient: Adam Potter           Date of Birth: [birth year]           MRN: [id number] Visit Date: 09/10/2023              Requested by: Katina Pfeiffer, PA-C [address] [place],  [place] [address] PCP: Katina Pfeiffer, PA-C   Assessment & Plan: Visit Diagnoses:  1. Acute bilateral low back pain with sciatica, sciatica laterality unspecified     Plan: Pleasant 48 year old gentleman who is 2 months status post injuring his left lower back when lifting something off the ground.  His has had a little bit of back problems in the past.  For 72 hours he had significant pain this did improve.  It is now been 2 months and although he feels better he has not returned to golf because he has been concerned about it.  Denies any paresthesias loss of bowel or bladder control he feels like he has a prominence on the left.  This is symmetric with his thin stature  PSIS  is very palpable .  I do not see any asymmetry he is neurovascularly intact I have advised him I think to watch this if he has any development of pain again or radicular symptoms he will call me.  Can return to golf but should do this slowly  Follow-Up Instructions: No follow-ups on file.   Orders:  No orders of the defined types were placed in this encounter.  No orders of the defined types were placed in this encounter.     Procedures: No procedures performed   Clinical Data: No additional findings.   Subjective: No chief complaint on file.   HPI Adam Potter is a 48 year old gentleman with a chief complaint of left-sided low back pain.  This began 2 months ago when he was lifting something.  He immediately felt a knot in his left lower back.  For 72 hours the pain was severe now is not as severe but he does not feel right.  He wears a back brace he notices it most when he is lifting something he takes ibuprofen as needed initially used some ice   Review of Systems  All other systems reviewed  and are negative.    Objective: Vital Signs: There were no vitals taken for this visit.  Physical Exam Constitutional:      Appearance: Normal appearance.  Pulmonary:     Effort: Pulmonary effort is normal.  Skin:    General: Skin is warm and dry.  Neurological:     General: No focal deficit present.     Mental Status: He is alert and oriented to person, place, and time.  Psychiatric:        Mood and Affect: Mood normal.        Behavior: Behavior normal.     Ortho Exam Low back he is neurovascularly intact he has some stiffness with flexion and extension.  He has good strength with dorsiflexion plantarflexion of his ankles extension and flexion of his knees no pain with manipulation of his hips.  No step-offs noted he does have some PSIS are easily palpable but symmetric bilaterally Specialty Comments:  No specialty comments available.  Imaging: No results found.   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) 08/26/2021  Cough variant asthma 02/19/2018   Upper airway cough syndrome 01/06/2017   Cigarette smoker 01/06/2017   Past Medical History:  Diagnosis Date   Asthma    Diabetes mellitus without complication (HCC)     Family History  Problem Relation Age of Onset   Diabetes Neg Hx     Past Surgical History:  Procedure Laterality Date   KNEE SURGERY Left    Social History   Occupational History   Not on file  Tobacco Use   Smoking status: Every Day    Current packs/day: 1.00    Average packs/day: 1 pack/day for 24.0 years (24.0 ttl pk-yrs)    Types: Cigarettes   Smokeless tobacco: Never  Vaping Use   Vaping status: Never Used  Substance and Sexual Activity   Alcohol use: Yes    Alcohol/week: 24.0 standard drinks of alcohol    Types: 24 Cans of beer per week   Drug use: Never   Sexual activity: Not on file

## 2023-09-11 ENCOUNTER — Encounter: Payer: Self-pay | Admitting: Internal Medicine

## 2023-09-11 MED ORDER — FREESTYLE LIBRE 2 PLUS SENSOR MISC: 1.0000 | 4 refills | Status: DC

## 2023-09-14 MED ORDER — FREESTYLE LIBRE 2 PLUS SENSOR MISC: 1.0000 | 4 refills | Status: DC

## 2023-09-14 NOTE — Addendum Note (Signed)
 Addended by: Vernon Goodpasture on: 09/14/2023 03:20 PM   Modules accepted: Orders

## 2023-10-29 ENCOUNTER — Other Ambulatory Visit: Payer: Self-pay | Admitting: Internal Medicine

## 2023-10-29 ENCOUNTER — Encounter: Payer: Self-pay | Admitting: Internal Medicine

## 2023-10-29 DIAGNOSIS — E139 Other specified diabetes mellitus without complications: Secondary | ICD-10-CM

## 2023-12-30 ENCOUNTER — Other Ambulatory Visit: Payer: Self-pay | Admitting: Internal Medicine

## 2023-12-30 NOTE — Telephone Encounter (Signed)
 Requested Prescriptions   Pending Prescriptions Disp Refills   pioglitazone  (ACTOS ) 30 MG tablet [Pharmacy Med Name: PIOGLITAZONE  HCL 30 MG TABLET] 45 tablet 1    Sig: TAKE 1/2 TABLET BY MOUTH EVERY DAY

## 2024-01-27 ENCOUNTER — Encounter: Payer: Self-pay | Admitting: Internal Medicine

## 2024-01-27 DIAGNOSIS — E139 Other specified diabetes mellitus without complications: Secondary | ICD-10-CM

## 2024-01-27 MED ORDER — SITAGLIPTIN PHOSPHATE 100 MG PO TABS: 100.0000 mg | ORAL_TABLET | Freq: Every day | ORAL | 2 refills | Status: DC

## 2024-03-02 ENCOUNTER — Ambulatory Visit: Payer: Managed Care, Other (non HMO) | Admitting: Internal Medicine

## 2024-03-02 NOTE — Progress Notes (Deleted)
 I patient ID: Adam Potter, male   DOB: August 15, 1975, 48 y.o.   MRN: [id number]  HPI: Adam Potter is a 48 y.o.-year-old male, returning for follow-up for LADA, dx 12/2021, but dx in 2015 as DM2,  insulin -dependent, uncontrolled, with complications (peripheral neuropathy). Pt. previously saw Dr. Kassie.  Last visit with me 4 months ago.    Interim history No increased urination, nausea, chest pain.  He is coming home from work: 10-11 pm (works a shift).  Eats late: 12-1 am.  Reviewed HbA1c levels: Lab Results  Component Value Date   HGBA1C 7.4 (A) 08/27/2023   HGBA1C 7.3 04/24/2023   HGBA1C 6.8 (A) 12/22/2022   HGBA1C 6.9 (A) 08/22/2022   HGBA1C 7.1 (A) 05/23/2022   HGBA1C 7.2 (A) 02/28/2022   HGBA1C 8.9 (A) 12/18/2021   HGBA1C 8.5 (A) 10/28/2021   HGBA1C 8.0 (A) 08/26/2021  03/19/2021: HbA1c 6.6%  Prev. on: - Metformin  ER 1000 >> 1500 >> 1000 mg a day - Glimepiride  4 >> 2 mg before breakfast - decreased 4 days ago because leg pain - Januvia  100 mg  before breakfast - Actos  45 mg  before breakfast  - started in 08/2021.  Then on: - Metformin  ER 1500 mg a day >> 500 mg 2x a day - Januvia  100 mg  before breakfast - Actos  45 >> 22.5 >> went back to 45 mg  before breakfast (had visual disturbance off it) >> 3/4 tab a day - Tresiba  8 units daily - started 12/2021 >> 4-5 >> 3 >> he increased to 6 units daily - Humalog  2-3 units 15 min before a larger meal - rec'd 05/2022 He had an episode of visual disturbance 3 days after he tried to stop Actos : he started to feel poorly and mentions that he was seeing several contours of the person in front of him.  He restarted Actos  right away.  However, he had another episode 2 weeks after starting this back.  His blood sugars were in the 120s when these episodes happened.  He is currently on: - Januvia  100 mg  before breakfast - Actos  30 mg  before breakfast (did not decrease to 15) - >> did not stop  12/2022 >> 250 mg 2x a day >> 1x a day -  Tresiba  4-5 >> 4 units daily in pm - Humalog  2 units at the beginning of a large meal or for correction of a high blood sugar  >> did not restart 12/2022 >> again advised to start >> only for correction, 2 units  He checks blood sugars at least 4 times a day with his CGM:  Prev.:  Previously:  Previously:   Lowest:  50s>> 50s >> 54. Highest: 400s >> 300s >> 300.  - no CKD, last BUN/creatinine:  Lab Results  Component Value Date   BUN 15 04/24/2023   BUN 18 02/28/2022   CREATININE 0.75 04/24/2023   CREATININE 0.97 02/28/2022   No results found for: MICRALBCREAT He is not on ACE inhibitor/ARB.  - + HL; last set of lipids: Lab Results  Component Value Date   CHOL 214 (H) 04/24/2023   HDL 85.90 04/24/2023   LDLCALC 119 (H) 04/24/2023   TRIG 48.0 04/24/2023   CHOLHDL 2 04/24/2023  He is not on a statin. He refused these.  - last eye exam was in 2020. No DR reportedly.   - + numbness and tingling in his feet. On Gabapentin - 300 mg prn.  Last foot exam 04/24/2023.  ROS: +  see HPI  Past Medical History:  Diagnosis Date   Asthma    Diabetes mellitus without complication (HCC)    Past Surgical History:  Procedure Laterality Date   KNEE SURGERY Left    Social History   Socioeconomic History   Marital status: Single    Spouse name: Not on file   Number of children: Not on file   Years of education: Not on file   Highest education level: Not on file  Occupational History   Not on file  Tobacco Use   Smoking status: Every Day    Current packs/day: 1.00    Average packs/day: 1 pack/day for 24.0 years (24.0 ttl pk-yrs)    Types: Cigarettes   Smokeless tobacco: Never  Vaping Use   Vaping status: Never Used  Substance and Sexual Activity   Alcohol use: Yes    Alcohol/week: 24.0 standard drinks of alcohol    Types: 24 Cans of beer per week   Drug use: Never   Sexual activity: Not on file  Other Topics Concern   Not on file  Social History Narrative   Not  on file   Social Drivers of Health   Financial Resource Strain: Not on file  Food Insecurity: Not on file  Transportation Needs: Not on file  Physical Activity: Not on file  Stress: Not on file  Social Connections: Not on file  Intimate Partner Violence: Not on file   Current Outpatient Medications on File Prior to Visit  Medication Sig Dispense Refill   albuterol (VENTOLIN HFA) 108 (90 Base) MCG/ACT inhaler Inhale 2 puffs into the lungs every 6 (six) hours as needed for wheezing or shortness of breath.     BD Sharps Container Home MISC Use as directed to dispose of needles 1 each PRN   budesonide -formoterol  (SYMBICORT ) 80-4.5 MCG/ACT inhaler Inhale 2 puffs into the lungs 2 (two) times daily. 1 Inhaler 11   Continuous Glucose Sensor (FREESTYLE LIBRE 2 PLUS SENSOR) MISC 1 each by Does not apply route every 14 (fourteen) days. 6 each 4   Glucagon  3 MG/DOSE POWD Place 3 mg into the nose once as needed for up to 1 dose. 1 each 11   ibuprofen (ADVIL,MOTRIN) 200 MG tablet Take 200 mg by mouth every 6 (six) hours as needed.     insulin  degludec (TRESIBA  FLEXTOUCH) 100 UNIT/ML FlexTouch Pen Inject 4 Units into the skin daily.     insulin  lispro (HUMALOG  KWIKPEN) 100 UNIT/ML KwikPen Inject 2-3 Units into the skin 3 (three) times daily. 15 mL 3   Insulin  Pen Needle (BD PEN NEEDLE NANO 2ND GEN) 32G X 4 MM MISC Use to inject insulin  4-5 x a day 500 each 4   metFORMIN  (GLUCOPHAGE ) 500 MG tablet Take 0.5 tablets (250 mg total) by mouth daily.     pioglitazone  (ACTOS ) 30 MG tablet TAKE 1/2 TABLET BY MOUTH EVERY DAY 45 tablet 1   sitaGLIPtin  (JANUVIA ) 100 MG tablet Take 1 tablet (100 mg total) by mouth daily. 90 tablet 2   No current facility-administered medications on file prior to visit.   No Known Allergies Family History  Problem Relation Age of Onset   Diabetes Neg Hx    PE: There were no vitals taken for this visit. Wt Readings from Last 3 Encounters:  08/27/23 119 lb 6.4 oz (54.2 kg)   04/24/23 118 lb 3.2 oz (53.6 kg)  12/22/22 117 lb 9.6 oz (53.3 kg)   Constitutional: thin, in NAD Eyes: no exophthalmos ENT:  no masses palpated in neck, no cervical lymphadenopathy Cardiovascular: RRR, No MRG Respiratory: CTA B Musculoskeletal: no deformities Skin:  no rashes Neurological: no tremor with outstretched hands  ASSESSMENT: 1. LADA, insulin -dependent, uncontrolled, with complications - PN  Investigation pointed towards LADA, rather than type 2 diabetes: Component     Latest Ref Rng 12/18/2021  Glucose Fasting, POC     70 - 99 mg/dL 686 !   Islet Cell Ab     Neg:<1:1  Negative   ZNT8 Antibodies     <15 U/mL <10   Glutamic Acid Decarb Ab     <5 IU/mL 226 (H)     Glucose     70 - 99 mg/dL 890 (H)   Glucose     65 - 99 mg/dL 896 (H)   C-Peptide     0.80 - 3.85 ng/mL 0.80    2.  Dyslipidemia  PLAN:  1. Patient with longstanding, uncontrolled, LADA, off insulin  in the past after he repeatedly refused to start it.  His GAD antibodies were elevated.  C-peptide was low.  We discussed that he absolutely needs to stay on insulin . -Diabetes control is hindered by the fact that he continues to drink alcohol (beer) every day when he comes home from work with subsequent low blood sugars in the afternoon/evening.  We did discuss about trying to stop alcohol, especially in the setting of his weight loss.  I advised him to stop metformin  at last visit for the same reason.  He tends to use less insulin  than recommended usually. He refused a referral to nutrition in the past. -HbA1c was higher, at 7.3%.  Sugars are higher but still within the target range in the second half of the night and first half of the day but dropping to the lower half of the target interval and sometimes even lower than 70 before midnight.  At last visit he was still on metformin , a low-dose.  He was cutting the metformin  ER tablet in half.  I explained that in that case it would not be extended release, with  instant release.  He was also using a low dose of Tresiba , 40 units daily, which I advised him to continue.  He did not reduce the Actos  dose as advised at the previous visit so I again advised him to try to take only 15 mg rather than 30 daily.  Also, at last visit he did not start Humalog  before each meal which I suggested in an effort to allow him to eat a little bit more due to his weight loss.  He was taking Humalog  sporadically, only for correction.  I advised him to use 2 units at the beginning of a large meal.  CGM interpretation: -At today's visit, we reviewed his CGM downloads: It appears that *** of values are in target range (goal >70%), while *** are higher than 180 (goal <25%), and *** are lower than 70 (goal <4%).  The calculated average blood sugar is ***.  The projected HbA1c for the next 3 months (GMI) is ***. -Reviewing the CGM trends, ***  -Reviewing the CGM trends, sugars are higher than at last visit, usually decreasing within the target range in the afternoon, but then increasing abruptly after each meal around 12 AM they continue to increase overnight, peaking around 7 to 8 AM.  At that time, he sometimes takes 2 units of Humalog  for correction which usually will bring the sugars down.  Is not taking Humalog  before meals.  We discussed today's visit that he does need to take Humalog  15 min before a meal to avoid postprandial hyperglycemia.  If he continues to take it only for correction, this induces variability in his blood sugars, with hyperglycemia right after meals and possible hypoglycemia later. -Since last visit, he did not decrease the dose of Actos  from 30 to 15 mg daily. Will continue this for now as he is very reticent to reduce the dose.  He did, however, reduce the dose of metformin , to only 250 mg daily.  Will also continue this dose. - I suggested to:  Patient Instructions  Please continue: - Metformin  250 mg 2x a day with meals - Januvia  100 mg  before breakfast -  Actos  30 mg  before breakfast  - Tresiba  4 units daily  Add: - Humalog  2-3 units before your main meals  Please return in 4-6 months.  - we checked his HbA1c: 7.4% (slightly higher) - advised to check sugars at different times of the day - 4x a day, rotating check times - advised for yearly eye exams >> he is not UTD 2/2 cost - return to clinic in 4-6  months  2.  Dyslipidemia -Latest lipid panel was reviewed from 04/2023: LDL above target, otherwise fractions at goal Lab Results  Component Value Date   CHOL 214 (H) 04/24/2023   HDL 85.90 04/24/2023   LDLCALC 119 (H) 04/24/2023   TRIG 48.0 04/24/2023   CHOLHDL 2 04/24/2023  -He refused statins.  At last visit, I recommended ezetimibe.  He did not start this.  Lela Fendt, MD PhD [facility] Endocrinology

## 2024-03-02 NOTE — Progress Notes (Deleted)
 I patient ID: Adam Potter, male   DOB: Oct 11, 1975, 48 y.o.   MRN: [id number]  HPI: Adam Potter is a 49 y.o.-year-old male, returning for follow-up for LADA, dx 12/2021, but dx in 2015 as DM2,  insulin -dependent, uncontrolled, with complications (peripheral neuropathy). Pt. previously saw Dr. Kassie.  Last visit with me 4 months ago.    Interim history No increased urination, nausea, chest pain.  He is coming home from work: 10-11 pm (works a shift).  Eats late: 12-1 am.  Reviewed HbA1c levels: Lab Results  Component Value Date   HGBA1C 7.4 (A) 08/27/2023   HGBA1C 7.3 04/24/2023   HGBA1C 6.8 (A) 12/22/2022   HGBA1C 6.9 (A) 08/22/2022   HGBA1C 7.1 (A) 05/23/2022   HGBA1C 7.2 (A) 02/28/2022   HGBA1C 8.9 (A) 12/18/2021   HGBA1C 8.5 (A) 10/28/2021   HGBA1C 8.0 (A) 08/26/2021  03/19/2021: HbA1c 6.6%  Prev. on: - Metformin  ER 1000 >> 1500 >> 1000 mg a day - Glimepiride  4 >> 2 mg before breakfast - decreased 4 days ago because leg pain - Januvia  100 mg  before breakfast - Actos  45 mg  before breakfast  - started in 08/2021.  Then on: - Metformin  ER 1500 mg a day >> 500 mg 2x a day - Januvia  100 mg  before breakfast - Actos  45 >> 22.5 >> went back to 45 mg  before breakfast (had visual disturbance off it) >> 3/4 tab a day - Tresiba  8 units daily - started 12/2021 >> 4-5 >> 3 >> he increased to 6 units daily - Humalog  2-3 units 15 min before a larger meal - rec'd 05/2022 He had an episode of visual disturbance 3 days after he tried to stop Actos : he started to feel poorly and mentions that he was seeing several contours of the person in front of him.  He restarted Actos  right away.  However, he had another episode 2 weeks after starting this back.  His blood sugars were in the 120s when these episodes happened.  He is currently on: - Januvia  100 mg  before breakfast - Actos  30 mg  before breakfast (did not decrease to 15) - >> did not stop  12/2022 >> 250 mg 2x a day >> 1x a day -  Tresiba  4-5 >> 4 units daily in pm - Humalog  2 units at the beginning of a large meal or for correction of a high blood sugar  >> did not restart 12/2022 >> again advised to start >> only for correction, 2 units  He checks blood sugars at least 4 times a day with his CGM:  Previously:  Previously:   Lowest:  50s>> 50s >> 54. Highest: 400s >> 300s >> 300.  - no CKD, last BUN/creatinine:  Lab Results  Component Value Date   BUN 15 04/24/2023   BUN 18 02/28/2022   CREATININE 0.75 04/24/2023   CREATININE 0.97 02/28/2022   No results found for: MICRALBCREAT He is not on ACE inhibitor/ARB.  - + HL; last set of lipids: Lab Results  Component Value Date   CHOL 214 (H) 04/24/2023   HDL 85.90 04/24/2023   LDLCALC 119 (H) 04/24/2023   TRIG 48.0 04/24/2023   CHOLHDL 2 04/24/2023  He is not on a statin. He refused these.  - last eye exam was in 2020. No DR reportedly.   - + numbness and tingling in his feet. On Gabapentin - 300 mg prn.  Last foot exam 04/24/2023.  ROS: + see  HPI  Past Medical History:  Diagnosis Date   Asthma    Diabetes mellitus without complication (HCC)    Past Surgical History:  Procedure Laterality Date   KNEE SURGERY Left    Social History   Socioeconomic History   Marital status: Single    Spouse name: Not on file   Number of children: Not on file   Years of education: Not on file   Highest education level: Not on file  Occupational History   Not on file  Tobacco Use   Smoking status: Every Day    Current packs/day: 1.00    Average packs/day: 1 pack/day for 24.0 years (24.0 ttl pk-yrs)    Types: Cigarettes   Smokeless tobacco: Never  Vaping Use   Vaping status: Never Used  Substance and Sexual Activity   Alcohol use: Yes    Alcohol/week: 24.0 standard drinks of alcohol    Types: 24 Cans of beer per week   Drug use: Never   Sexual activity: Not on file  Other Topics Concern   Not on file  Social History Narrative   Not on file    Social Drivers of Health   Financial Resource Strain: Not on file  Food Insecurity: Not on file  Transportation Needs: Not on file  Physical Activity: Not on file  Stress: Not on file  Social Connections: Not on file  Intimate Partner Violence: Not on file   Current Outpatient Medications on File Prior to Visit  Medication Sig Dispense Refill   albuterol (VENTOLIN HFA) 108 (90 Base) MCG/ACT inhaler Inhale 2 puffs into the lungs every 6 (six) hours as needed for wheezing or shortness of breath.     BD Sharps Container Home MISC Use as directed to dispose of needles 1 each PRN   budesonide -formoterol  (SYMBICORT ) 80-4.5 MCG/ACT inhaler Inhale 2 puffs into the lungs 2 (two) times daily. 1 Inhaler 11   Continuous Glucose Sensor (FREESTYLE LIBRE 2 PLUS SENSOR) MISC 1 each by Does not apply route every 14 (fourteen) days. 6 each 4   Glucagon  3 MG/DOSE POWD Place 3 mg into the nose once as needed for up to 1 dose. 1 each 11   ibuprofen (ADVIL,MOTRIN) 200 MG tablet Take 200 mg by mouth every 6 (six) hours as needed.     insulin  degludec (TRESIBA  FLEXTOUCH) 100 UNIT/ML FlexTouch Pen Inject 4 Units into the skin daily.     insulin  lispro (HUMALOG  KWIKPEN) 100 UNIT/ML KwikPen Inject 2-3 Units into the skin 3 (three) times daily. 15 mL 3   Insulin  Pen Needle (BD PEN NEEDLE NANO 2ND GEN) 32G X 4 MM MISC Use to inject insulin  4-5 x a day 500 each 4   metFORMIN  (GLUCOPHAGE ) 500 MG tablet Take 0.5 tablets (250 mg total) by mouth daily.     pioglitazone  (ACTOS ) 30 MG tablet TAKE 1/2 TABLET BY MOUTH EVERY DAY 45 tablet 1   sitaGLIPtin  (JANUVIA ) 100 MG tablet Take 1 tablet (100 mg total) by mouth daily. 90 tablet 2   No current facility-administered medications on file prior to visit.   No Known Allergies Family History  Problem Relation Age of Onset   Diabetes Neg Hx    PE: There were no vitals taken for this visit. Wt Readings from Last 3 Encounters:  08/27/23 119 lb 6.4 oz (54.2 kg)  04/24/23  118 lb 3.2 oz (53.6 kg)  12/22/22 117 lb 9.6 oz (53.3 kg)   Constitutional: thin, in NAD Eyes: no exophthalmos ENT: no  masses palpated in neck, no cervical lymphadenopathy Cardiovascular: RRR, No MRG Respiratory: CTA B Musculoskeletal: no deformities Skin:  no rashes Neurological: no tremor with outstretched hands  ASSESSMENT: 1. LADA, insulin -dependent, uncontrolled, with complications - PN  Investigation pointed towards LADA, rather than type 2 diabetes: Component     Latest Ref Rng 12/18/2021  Glucose Fasting, POC     70 - 99 mg/dL 686 !   Islet Cell Ab     Neg:<1:1  Negative   ZNT8 Antibodies     <15 U/mL <10   Glutamic Acid Decarb Ab     <5 IU/mL 226 (H)     Glucose     70 - 99 mg/dL 890 (H)   Glucose     65 - 99 mg/dL 896 (H)   C-Peptide     0.80 - 3.85 ng/mL 0.80    2.  Dyslipidemia  PLAN:  1. Patient with   longstanding, uncontrolled, LADA, off insulin  in the past after he repeatedly refused to start it.  His GAD antibodies were elevated.  C-peptide was low.  We discussed that he absolutely needs to stay on insulin . -Diabetes control is hindered by the fact that he continues to drink alcohol (beer) every day when he comes home from work with subsequent low blood sugars in the afternoon/evening.  We did discuss about trying to stop alcohol, especially in the setting of his weight loss.  I advised him to stop metformin  at last visit for the same reason.  He tends to use less insulin  than recommended usually. He refused a referral to nutrition in the past. -HbA1c was higher, at 7.3%.  Sugars are higher but still within the target range in the second half of the night and first half of the day but dropping to the lower half of the target interval and sometimes even lower than 70 before midnight.  At last visit he was still on metformin , a low-dose.  He was cutting the metformin  ER tablet in half.  I explained that in that case it would not be extended release, with  instant release.  He was also using a low dose of Tresiba , 40 units daily, which I advised him to continue.  He did not reduce the Actos  dose as advised at the previous visit so I again advised him to try to take only 15 mg rather than 30 daily.  Also, at last visit he did not start Humalog  before each meal which I suggested in an effort to allow him to eat a little bit more due to his weight loss.  He was taking Humalog  sporadically, only for correction.  I advised him to use 2 units at the beginning of a large meal.  CGM interpretation: -At today's visit, we reviewed his CGM downloads: It appears that *** of values are in target range (goal >70%), while *** are higher than 180 (goal <25%), and *** are lower than 70 (goal <4%).  The calculated average blood sugar is ***.  The projected HbA1c for the next 3 months (GMI) is ***. -Reviewing the CGM trends, ***  -Reviewing the CGM trends, sugars are higher than at last visit, usually decreasing within the target range in the afternoon, but then increasing abruptly after each meal around 12 AM they continue to increase overnight, peaking around 7 to 8 AM.  At that time, he sometimes takes 2 units of Humalog  for correction which usually will bring the sugars down.  Is not taking Humalog  before meals.  We discussed today's visit that he does need to take Humalog  15 min before a meal to avoid postprandial hyperglycemia.  If he continues to take it only for correction, this induces variability in his blood sugars, with hyperglycemia right after meals and possible hypoglycemia later. -Since last visit, he did not decrease the dose of Actos  from 30 to 15 mg daily. Will continue this for now as he is very reticent to reduce the dose.  He did, however, reduce the dose of metformin , to only 250 mg daily.  Will also continue this dose. - I suggested to:  Patient Instructions  Please continue: - Metformin  250 mg 2x a day with meals - Januvia  100 mg  before breakfast -  Actos  30 mg  before breakfast  - Tresiba  4 units daily  Add: - Humalog  2-3 units before your main meals  Please return in 4-6 months.  - we checked his HbA1c: 7.4% (slightly higher) - advised to check sugars at different times of the day - 4x a day, rotating check times - advised for yearly eye exams >> he is not UTD 2/2 cost - return to clinic in 4-6  months  2.  Dyslipidemia -Latest lipid panel was reviewed from 04/2023: LDL above target, otherwise fractions at goal Lab Results  Component Value Date   CHOL 214 (H) 04/24/2023   HDL 85.90 04/24/2023   LDLCALC 119 (H) 04/24/2023   TRIG 48.0 04/24/2023   CHOLHDL 2 04/24/2023  -He refused statins.  At last visit, I recommended ezetimibe.  He did not start this.  Lela Fendt, MD PhD [facility] Endocrinology

## 2024-03-04 ENCOUNTER — Encounter: Payer: Self-pay | Admitting: Internal Medicine

## 2024-03-04 ENCOUNTER — Ambulatory Visit: Admitting: Internal Medicine

## 2024-03-04 VITALS — BP 112/60 | HR 71 | Ht 66.0 in | Wt 114.6 lb

## 2024-03-04 DIAGNOSIS — E785 Hyperlipidemia, unspecified: Secondary | ICD-10-CM

## 2024-03-04 DIAGNOSIS — E139 Other specified diabetes mellitus without complications: Secondary | ICD-10-CM

## 2024-03-04 LAB — POCT GLYCOSYLATED HEMOGLOBIN (HGB A1C): Hemoglobin A1C: 6.6 % — AB (ref 4.0–5.6)

## 2024-03-04 MED ORDER — TRESIBA FLEXTOUCH 100 UNIT/ML ~~LOC~~ SOPN: 5.0000 [IU] | PEN_INJECTOR | Freq: Every day | SUBCUTANEOUS | Status: DC

## 2024-03-04 NOTE — Patient Instructions (Addendum)
 Please continue: - Januvia  100 mg  before breakfast - Tresiba  5 units daily  Take: - Humalog  2 units before a larger meal  Please see your PCP for your chest pain/cough.   Please return in 4-6 months.

## 2024-03-04 NOTE — Progress Notes (Addendum)
 Patient ID: Adam Potter, male   DOB: 09-10-1975, 48 y.o.   MRN: [id number] This note was precharted 03/02/2024.  HPI: Adam Potter is a 48 y.o.-year-old male, returning for follow-up for LADA, dx 12/2021, but dx in 2015 as DM2,  insulin -dependent, uncontrolled, with complications (peripheral neuropathy). Pt. previously saw Dr. Kassie.  Last visit with me 6 months ago.    Interim history No increased urination, nausea. He has chest pain and a cough for 4 months.. He is coming home from work: 10-11 pm (works a shift).  Eats late: 12-1 am. He stopped Actos  and Metformin  1.5 mo ago as he felt this was causing him weight loss.  He feels well after stopping them.  He did not gain weight, though.  He also started to improved his diet.  His girlfriend is helping him with this.  Reviewed HbA1c levels: Lab Results  Component Value Date   HGBA1C 7.4 (A) 08/27/2023   HGBA1C 7.3 04/24/2023   HGBA1C 6.8 (A) 12/22/2022   HGBA1C 6.9 (A) 08/22/2022   HGBA1C 7.1 (A) 05/23/2022   HGBA1C 7.2 (A) 02/28/2022   HGBA1C 8.9 (A) 12/18/2021   HGBA1C 8.5 (A) 10/28/2021   HGBA1C 8.0 (A) 08/26/2021  03/19/2021: HbA1c 6.6%  Prev. on: - Metformin  ER 1000 >> 1500 >> 1000 mg a day - Glimepiride  4 >> 2 mg before breakfast - decreased 4 days ago because leg pain - Januvia  100 mg  before breakfast - Actos  45 mg  before breakfast  - started in 08/2021.  Then on: - Metformin  ER 1500 mg a day >> 500 mg 2x a day - Januvia  100 mg  before breakfast - Actos  45 >> 22.5 >> went back to 45 mg  before breakfast (had visual disturbance off it) >> 3/4 tab a day - Tresiba  8 units daily - started 12/2021 >> 4-5 >> 3 >> he increased to 6 units daily - Humalog  2-3 units 15 min before a larger meal - rec'd 05/2022 He had an episode of visual disturbance 3 days after he tried to stop Actos : he started to feel poorly and mentions that he was seeing several contours of the person in front of him.  He restarted Actos  right away.   However, he had another episode 2 weeks after starting this back.  His blood sugars were in the 120s when these episodes happened.  At last visit he was on: - Januvia  100 mg  before breakfast - Actos  30 mg  before breakfast (did not decrease to 15) - >> did not stop  12/2022 >> 250 mg 2x a day >> 1x a day - Tresiba  4-5 >> 4 units daily in pm - Humalog  2 units at the beginning of a large meal or for correction of a high blood sugar  >> did not restart 12/2022 >> again advised to start >> only for correction, 2 units  I recommended: - Metformin  250 mg 2x a day with meals  >> not taking - Januvia  100 mg  before breakfast - Actos  30 mg  before breakfast  >> not taking - Tresiba  4 >> 5 units daily - Humalog  2-3 units before your main meals >> not taking  He checks blood sugars at least 4 times a day with his CGM:  Previously:   Previously:   Lowest:  50s>> 50s >> 54 >> 51. Highest: 400s >> 300s >> 300 >> 256.  - no CKD, last BUN/creatinine:  Lab Results  Component Value Date   BUN  15 04/24/2023   BUN 18 02/28/2022   CREATININE 0.75 04/24/2023   CREATININE 0.97 02/28/2022   No results found for: MICRALBCREAT He is not on ACE inhibitor/ARB.  - + HL; last set of lipids: Lab Results  Component Value Date   CHOL 214 (H) 04/24/2023   HDL 85.90 04/24/2023   LDLCALC 119 (H) 04/24/2023   TRIG 48.0 04/24/2023   CHOLHDL 2 04/24/2023  He is not on a statin. He refused these.  - last eye exam was in 2020. No DR reportedly. $$.  - + numbness and tingling in his feet. He was on Gabapentin (for back pain) - 300 mg - not taking except prn.  Last foot exam 04/24/2023.  ROS: + see HPI  Past Medical History:  Diagnosis Date   Asthma    Diabetes mellitus without complication (HCC)    Past Surgical History:  Procedure Laterality Date   KNEE SURGERY Left    Social History   Socioeconomic History   Marital status: Single    Spouse name: Not on file   Number of children: Not on  file   Years of education: Not on file   Highest education level: Not on file  Occupational History   Not on file  Tobacco Use   Smoking status: Every Day    Current packs/day: 1.00    Average packs/day: 1 pack/day for 24.0 years (24.0 ttl pk-yrs)    Types: Cigarettes   Smokeless tobacco: Never  Vaping Use   Vaping status: Never Used  Substance and Sexual Activity   Alcohol use: Yes    Alcohol/week: 24.0 standard drinks of alcohol    Types: 24 Cans of beer per week   Drug use: Never   Sexual activity: Not on file  Other Topics Concern   Not on file  Social History Narrative   Not on file   Social Drivers of Health   Financial Resource Strain: Not on file  Food Insecurity: Not on file  Transportation Needs: Not on file  Physical Activity: Not on file  Stress: Not on file  Social Connections: Not on file  Intimate Partner Violence: Not on file   Current Outpatient Medications on File Prior to Visit  Medication Sig Dispense Refill   albuterol (VENTOLIN HFA) 108 (90 Base) MCG/ACT inhaler Inhale 2 puffs into the lungs every 6 (six) hours as needed for wheezing or shortness of breath.     BD Sharps Container Home MISC Use as directed to dispose of needles 1 each PRN   budesonide -formoterol  (SYMBICORT ) 80-4.5 MCG/ACT inhaler Inhale 2 puffs into the lungs 2 (two) times daily. 1 Inhaler 11   Continuous Glucose Sensor (FREESTYLE LIBRE 2 PLUS SENSOR) MISC 1 each by Does not apply route every 14 (fourteen) days. 6 each 4   Glucagon  3 MG/DOSE POWD Place 3 mg into the nose once as needed for up to 1 dose. 1 each 11   ibuprofen (ADVIL,MOTRIN) 200 MG tablet Take 200 mg by mouth every 6 (six) hours as needed.     insulin  degludec (TRESIBA  FLEXTOUCH) 100 UNIT/ML FlexTouch Pen Inject 4 Units into the skin daily.     insulin  lispro (HUMALOG  KWIKPEN) 100 UNIT/ML KwikPen Inject 2-3 Units into the skin 3 (three) times daily. 15 mL 3   Insulin  Pen Needle (BD PEN NEEDLE NANO 2ND GEN) 32G X 4 MM  MISC Use to inject insulin  4-5 x a day 500 each 4   metFORMIN  (GLUCOPHAGE ) 500 MG tablet Take 0.5 tablets (  250 mg total) by mouth daily.     pioglitazone  (ACTOS ) 30 MG tablet TAKE 1/2 TABLET BY MOUTH EVERY DAY 45 tablet 1   sitaGLIPtin  (JANUVIA ) 100 MG tablet Take 1 tablet (100 mg total) by mouth daily. 90 tablet 2   No current facility-administered medications on file prior to visit.   No Known Allergies Family History  Problem Relation Age of Onset   Diabetes Neg Hx    PE: BP 112/60   Pulse 71   Ht 5' 6 (1.676 m)   Wt 114 lb 9.6 oz (52 kg)   SpO2 99%   BMI 18.50 kg/m  Wt Readings from Last 3 Encounters:  03/04/24 114 lb 9.6 oz (52 kg)  08/27/23 119 lb 6.4 oz (54.2 kg)  04/24/23 118 lb 3.2 oz (53.6 kg)   Constitutional: thin, in NAD Eyes: no exophthalmos ENT: no masses palpated in neck, no cervical lymphadenopathy Cardiovascular: RRR, No MRG Respiratory: CTA B Musculoskeletal: no deformities Skin:  no rashes Neurological: no tremor with outstretched hands Diabetic Foot Exam - Simple   Simple Foot Form Diabetic Foot exam was performed with the following findings: Yes 03/04/2024 11:15 AM  Visual Inspection No deformities, no ulcerations, no other skin breakdown bilaterally: Yes Sensation Testing Intact to touch and monofilament testing bilaterally: Yes Pulse Check Posterior Tibialis and Dorsalis pulse intact bilaterally: Yes Comments    ASSESSMENT: 1. LADA, insulin -dependent, uncontrolled, with complications - PN  Investigation pointed towards LADA, rather than type 2 diabetes: Component     Latest Ref Rng 12/18/2021  Glucose Fasting, POC     70 - 99 mg/dL 686 !   Islet Cell Ab     Neg:<1:1  Negative   ZNT8 Antibodies     <15 U/mL <10   Glutamic Acid Decarb Ab     <5 IU/mL 226 (H)     Glucose     70 - 99 mg/dL 890 (H)   Glucose     65 - 99 mg/dL 896 (H)   C-Peptide     0.80 - 3.85 ng/mL 0.80    2.  Dyslipidemia  PLAN:  1. Patient with longstanding,  uncontrolled, LADA, off insulin  in the past after repeatedly refusing to start it.  His GAD antibodies were elevated and C-peptide was low.  We discussed that he absolutely needed to stay on insulin .  His diabetes control is hindered by the fact that he continues to drink beer every day when he comes home from work.  He has subsequent low blood sugars in the afternoon/evening.  We discussed repeatedly about trying to stop alcohol especially in the setting of his weight loss.  I also advised him to stop metformin  for the same reason but he restarted it.  He usually tends to use less insulin  than recommended and at last visit he was not taking the Humalog , only sporadically and only for correction.  I advised him to use 2 units at the beginning of his meals.  At that time, sugars were higher, decreasing within target range in the afternoon but then increasing abruptly after each meal and also overnight, peaking around 7 to 8 AM.  At that time, he was taking 2 units of Humalog  for correction which was bringing the sugars down.  However, he was not taking Humalog  before meals as advised.  Also, he previously declined decreasing the dose of Actos  from 30 to 15 mg as advised so we continued the same dose due to the fact that he was  very reticent to reduce the dose. CGM interpretation: -At today's visit, we reviewed his CGM downloads: It appears that 94% of values are in target range (goal >70%), while 6% are higher than 180 (goal <25%), and 0% are lower than 70 (goal <4%).  The calculated average blood sugar is 128.  The projected HbA1c for the next 3 months (GMI) is 6.4%. -Reviewing the CGM trends, sugars appear to be much improved from before especially overnight.  He has occasional hyperglycemic peaks after larger meals but upon questioning, this is likely because he is not taking any Humalog  before meals.  He is taking occasionally 3 units for correction, which drops his blood sugars too low after the initial  postprandial peaks.  I again advised him to try to take 2 units of Humalog  ideally before larger meals but he does not appear to be needing this before all of his meals.  He tells me that since last visit he stopped metformin  and Actos  in an effort to gain weight, however, he did not gain weight since last visit.  His sugars being better, I did not recommend to start this back. -At today's visit, he mentions a cough for the last 4 months and also some chest pain related to this.  I recommended to try to see his PCP for this.  - I suggested to:  Patient Instructions  Please continue: - Januvia  100 mg  before breakfast - Tresiba  5 units daily  Take: - Humalog  2 units before a larger meal  Please see your PCP for your chest pain/cough.   Please return in 4-6 months.  - we checked his HbA1c: 6.6% (lower)  - advised to check sugars at different times of the day - 4x a day, rotating check times - advised for yearly eye exams >> he is not UTD due to price - will check annual labs today - return to clinic in  4-6 months  2.  Dyslipidemia - Latest lipid panel was reviewed from last year: LDL above target, otherwise fractions at goal: Lab Results  Component Value Date   CHOL 214 (H) 04/24/2023   HDL 85.90 04/24/2023   LDLCALC 119 (H) 04/24/2023   TRIG 48.0 04/24/2023   CHOLHDL 2 04/24/2023  - He refused statins or ezetimibe - he is due for another lipid panel-will check this today   Component     Latest Ref Rng 03/04/2024  Hemoglobin A1C     4.0 - 5.6 % 6.6 !   Sodium     135 - 146 mmol/L 137   Potassium     3.5 - 5.3 mmol/L 4.8   Chloride     98 - 110 mmol/L 102   CO2     20 - 32 mmol/L 27   Glucose     65 - 99 mg/dL 864 (H)   BUN     7 - 25 mg/dL 19   Creatinine     9.39 - 1.29 mg/dL 9.33   Total Bilirubin     0.2 - 1.2 mg/dL 0.5   AST     10 - 40 U/L 20   ALT     9 - 46 U/L 14   Total Protein     6.1 - 8.1 g/dL 6.9   Calcium     8.6 - 10.3 mg/dL 9.3    Cholesterol     <200 mg/dL 749 (H)   Triglycerides     <150 mg/dL 98   HDL Cholesterol     >  OR = 40 mg/dL 70   Total CHOL/HDL Ratio     <5.0 (calc) 3.6   Creatinine, Urine     20 - 320 mg/dL 72   Microalb, Ur     mg/dL <9.7   MICROALB/CREAT RATIO     <30 mg/g creat NOTE   LDL Cholesterol (Calc)     mg/dL (calc) 840 (H)   Non-HDL Cholesterol (Calc)     <130 mg/dL (calc) 819 (H)   eGFR     > OR = 60 mL/min/1.106m2 115   BUN/Creatinine Ratio     6 - 22 (calc) SEE NOTE:   Albumin MSPROF     3.6 - 5.1 g/dL 4.7   Globulin     1.9 - 3.7 g/dL (calc) 2.2   AG Ratio     1.0 - 2.5 (calc) 2.1   Alkaline phosphatase (APISO)     36 - 130 U/L 30 (L)   LDL is elevated, otherwise labs are mostly at goal.  Unfortunately, he refused statins and ezetimibe in the past.  I will recommend this again.  Lela Fendt, MD PhD [facility] Endocrin[facility]

## 2024-03-05 LAB — COMPREHENSIVE METABOLIC PANEL WITH GFR
AG Ratio: 2.1 (calc) (ref 1.0–2.5)
ALT: 14 U/L (ref 9–46)
AST: 20 U/L (ref 10–40)
Albumin: 4.7 g/dL (ref 3.6–5.1)
Alkaline phosphatase (APISO): 30 U/L — ABNORMAL LOW (ref 36–130)
BUN: 19 mg/dL (ref 7–25)
CO2: 27 mmol/L (ref 20–32)
Calcium: 9.3 mg/dL (ref 8.6–10.3)
Chloride: 102 mmol/L (ref 98–110)
Creat: 0.66 mg/dL (ref 0.60–1.29)
Globulin: 2.2 g/dL (ref 1.9–3.7)
Glucose, Bld: 135 mg/dL — ABNORMAL HIGH (ref 65–99)
Potassium: 4.8 mmol/L (ref 3.5–5.3)
Sodium: 137 mmol/L (ref 135–146)
Total Bilirubin: 0.5 mg/dL (ref 0.2–1.2)
Total Protein: 6.9 g/dL (ref 6.1–8.1)
eGFR: 115 mL/min/1.73m2 (ref >=60)

## 2024-03-05 LAB — MICROALBUMIN / CREATININE URINE RATIO
Creatinine, Urine: 72 mg/dL (ref 20–320)
Microalb, Ur: <0.2 mg/dL

## 2024-03-05 LAB — LIPID PANEL W/REFLEX DIRECT LDL
Cholesterol: 250 mg/dL — ABNORMAL HIGH (ref <200)
HDL: 70 mg/dL (ref >=40)
LDL Cholesterol (Calc): 159 mg/dL — ABNORMAL HIGH
Non-HDL Cholesterol (Calc): 180 mg/dL — ABNORMAL HIGH (ref <130)
Total CHOL/HDL Ratio: 3.6 (calc) (ref <5.0)
Triglycerides: 98 mg/dL (ref <150)

## 2024-03-07 ENCOUNTER — Ambulatory Visit: Payer: Self-pay | Admitting: Internal Medicine

## 2024-06-06 ENCOUNTER — Encounter: Payer: Self-pay | Admitting: Radiology

## 2024-09-02 ENCOUNTER — Other Ambulatory Visit: Payer: Self-pay | Admitting: Internal Medicine

## 2024-09-02 ENCOUNTER — Encounter: Payer: Self-pay | Admitting: Internal Medicine

## 2024-09-08 ENCOUNTER — Encounter: Payer: Self-pay | Admitting: Internal Medicine

## 2024-09-08 ENCOUNTER — Ambulatory Visit: Admitting: Internal Medicine

## 2024-09-08 VITALS — BP 120/70 | HR 86 | Ht 66.0 in | Wt 124.0 lb

## 2024-09-08 DIAGNOSIS — E785 Hyperlipidemia, unspecified: Secondary | ICD-10-CM

## 2024-09-08 DIAGNOSIS — E139 Other specified diabetes mellitus without complications: Secondary | ICD-10-CM

## 2024-09-08 LAB — POCT GLYCOSYLATED HEMOGLOBIN (HGB A1C): Hemoglobin A1C: 7.3 % — AB (ref 4.0–5.6)

## 2024-09-08 MED ORDER — FREESTYLE LIBRE 2 PLUS SENSOR MISC: 1.0000 | 4 refills | Status: AC

## 2024-09-08 MED ORDER — SITAGLIPTIN PHOSPHATE 100 MG PO TABS: 100.0000 mg | ORAL_TABLET | Freq: Every day | ORAL | 3 refills | Status: AC

## 2024-09-08 MED ORDER — GLUCAGON 3 MG/DOSE NA POWD: 3.0000 mg | Freq: Once | NASAL | 11 refills | Status: AC | PRN

## 2024-09-08 NOTE — Patient Instructions (Addendum)
 Please continue: - Januvia  100 mg  before breakfast - Tresiba  5 units daily  Take: - Humalog  1-2 units 15 min before dinner unless the sugars are <90, when you should inject at the start of the meal   Please return in 4-6 months.

## 2024-09-08 NOTE — Progress Notes (Signed)
 Patient ID: Adam Potter, male   DOB: [birth year], 49 y.o.   MRN: [id number]  HPI: Adam Potter is a 49 y.o.-year-old male, returning for follow-up for LADA, dx 12/2021, but dx in 2015 as DM2,  insulin -dependent, uncontrolled, with complications (peripheral neuropathy). Pt. previously saw Dr. Kassie.  Last visit with me 6 months ago.    Interim history No increased urination, nausea. He had SOB, chest pain, and a cough for 4 months before last visit.  Now resolved after having dental extractions. He is coming home from work: 10-11 pm (works a shift).  Eats late: 12-1 am. He stopped Actos  and Metformin  before last visit as he felt they were causing him to lose weight.  He did not gain weight, though.  Before last visit, he also started to improved his diet.  His girlfriend is helping him with this.  However, at today's visit, he tells me he is still drinking beers in the pm after which his sugars are low.  Reviewed HbA1c levels: Lab Results  Component Value Date   HGBA1C 6.6 (A) 03/04/2024   HGBA1C 7.4 (A) 08/27/2023   HGBA1C 7.3 04/24/2023   HGBA1C 6.8 (A) 12/22/2022   HGBA1C 6.9 (A) 08/22/2022   HGBA1C 7.1 (A) 05/23/2022   HGBA1C 7.2 (A) 02/28/2022   HGBA1C 8.9 (A) 12/18/2021   HGBA1C 8.5 (A) 10/28/2021   HGBA1C 8.0 (A) 08/26/2021  03/19/2021: HbA1c 6.6%  Prev. on: - Metformin  ER 1000 >> 1500 >> 1000 mg a day - Glimepiride  4 >> 2 mg before breakfast - decreased 4 days ago because leg pain - Januvia  100 mg  before breakfast - Actos  45 mg  before breakfast  - started in 08/2021.  Then on: - Metformin  ER 1500 mg a day >> 500 mg 2x a day - Januvia  100 mg  before breakfast - Actos  45 >> 22.5 >> went back to 45 mg  before breakfast (had visual disturbance off it) >> 3/4 tab a day - Tresiba  8 units daily - started 12/2021 >> 4-5 >> 3 >> he increased to 6 units daily - Humalog  2-3 units 15 min before a larger meal - rec'd 05/2022 He had an episode of visual disturbance 3 days after he  tried to stop Actos : he started to feel poorly and mentions that he was seeing several contours of the person in front of him.  He restarted Actos  right away.  However, he had another episode 2 weeks after starting this back.  His blood sugars were in the 120s when these episodes happened.  Then on: - Januvia  100 mg  before breakfast - Actos  30 mg  before breakfast (did not decrease to 15) - >> did not stop  12/2022 >> 250 mg 2x a day >> 1x a day - Tresiba  4-5 >> 4 units daily in pm - Humalog  2 units at the beginning of a large meal or for correction of a high blood sugar  >> did not restart 12/2022 >> again advised to start >> only for correction, 2 units  I recommended: - Januvia  100 mg  before breakfast - Tresiba  4 >> 5 units daily - Humalog  2-3 units before your main meals >> not taking >> advised him to restart before a larger meal >> only rarely taking At our visit from 03/2024, he was off metformin  and Actos  due to perceived weight loss.  We did not restart these.  He checks blood sugars at least 4 times a day with his CGM:  Previously:  Previously:   Previously:   Lowest:  50s>> 50s >> 50 >> 51 >> 52. Highest: 400s >> 300s >> 300 >> 256 >> 360.  - no CKD, last BUN/creatinine:  Lab Results  Component Value Date   BUN 19 03/04/2024   BUN 15 04/24/2023   CREATININE 0.66 03/04/2024   CREATININE 0.75 04/24/2023   Lab Results  Component Value Date   MICRALBCREAT NOTE 03/04/2024   He is not on ACE inhibitor/ARB.  - + HL; last set of lipids: Lab Results  Component Value Date   CHOL 250 (H) 03/04/2024   HDL 70 03/04/2024   LDLCALC 159 (H) 03/04/2024   TRIG 98 03/04/2024   CHOLHDL 3.6 03/04/2024  He is not on a statin. He refused these.  - last eye exam was in 2020. No DR reportedly. $$.  - + numbness and tingling in his feet. He was on Gabapentin (for back pain) - 300 mg - not taking except prn.  Last foot exam 03/2024.  ROS: + see HPI  Past Medical History:   Diagnosis Date   Asthma    Diabetes mellitus without complication (HCC)    Past Surgical History:  Procedure Laterality Date   KNEE SURGERY Left    Social History   Socioeconomic History   Marital status: Single    Spouse name: Not on file   Number of children: Not on file   Years of education: Not on file   Highest education level: Not on file  Occupational History   Not on file  Tobacco Use   Smoking status: Every Day    Current packs/day: 1.00    Average packs/day: 1 pack/day for 24.0 years (24.0 ttl pk-yrs)    Types: Cigarettes   Smokeless tobacco: Never  Vaping Use   Vaping status: Never Used  Substance and Sexual Activity   Alcohol use: Yes    Alcohol/week: 24.0 standard drinks of alcohol    Types: 24 Cans of beer per week   Drug use: Never   Sexual activity: Not on file  Other Topics Concern   Not on file  Social History Narrative   Not on file   Social Drivers of Health   Tobacco Use: High Risk (03/04/2024)   Patient History    Smoking Tobacco Use: Every Day    Smokeless Tobacco Use: Never    Passive Exposure: Not on file  Financial Resource Strain: Not on file  Food Insecurity: Not on file  Transportation Needs: Not on file  Physical Activity: Not on file  Stress: Not on file  Social Connections: Not on file  Intimate Partner Violence: Not on file  Depression (EYV7-0): Not on file  Alcohol Screen: Not on file  Housing: Not on file  Utilities: Not on file  Health Literacy: Not on file   Current Outpatient Medications on File Prior to Visit  Medication Sig Dispense Refill   albuterol (VENTOLIN HFA) 108 (90 Base) MCG/ACT inhaler Inhale 2 puffs into the lungs every 6 (six) hours as needed for wheezing or shortness of breath. (Patient not taking: Reported on 03/04/2024)     BD [employer] MISC Use as directed to dispose of needles 1 each PRN   budesonide -formoterol  (SYMBICORT ) 80-4.5 MCG/ACT inhaler Inhale 2 puffs into the lungs 2 (two) times  daily. (Patient not taking: Reported on 03/04/2024) 1 Inhaler 11   Continuous Glucose Sensor (FREESTYLE LIBRE 2 PLUS SENSOR) MISC 1 each by Does not apply route every 14 (fourteen)  days. 6 each 4   Glucagon  3 MG/DOSE POWD Place 3 mg into the nose once as needed for up to 1 dose. 1 each 11   ibuprofen (ADVIL,MOTRIN) 200 MG tablet Take 200 mg by mouth every 6 (six) hours as needed.     insulin  degludec (TRESIBA  FLEXTOUCH) 100 UNIT/ML FlexTouch Pen Inject 5-8 Units into the skin daily. 15 mL 3   insulin  lispro (HUMALOG  KWIKPEN) 100 UNIT/ML KwikPen Inject 2-3 Units into the skin 3 (three) times daily. 15 mL 3   Insulin  Pen Needle (BD PEN NEEDLE NANO 2ND GEN) 32G X 4 MM MISC Use to inject insulin  4-5 x a day 500 each 4   sitaGLIPtin  (JANUVIA ) 100 MG tablet Take 1 tablet (100 mg total) by mouth daily. 90 tablet 2   No current facility-administered medications on file prior to visit.   No Known Allergies Family History  Problem Relation Age of Onset   Diabetes Neg Hx    PE: BP 120/70   Pulse 86   Ht 5' 6 (1.676 m)   Wt 124 lb (56.2 kg)   SpO2 99%   BMI 20.01 kg/m  Wt Readings from Last 3 Encounters:  09/08/24 124 lb (56.2 kg)  03/04/24 114 lb 9.6 oz (52 kg)  08/27/23 119 lb 6.4 oz (54.2 kg)   Constitutional: thin, in NAD Eyes: no exophthalmos ENT: no masses palpated in neck, no cervical lymphadenopathy Cardiovascular: RRR, No MRG Respiratory: CTA B Musculoskeletal: no deformities Skin:  no rashes Neurological: no tremor with outstretched hands  ASSESSMENT: 1. LADA, insulin -dependent, uncontrolled, with complications - PN  Investigation pointed towards LADA, rather than type 2 diabetes: Component     Latest Ref Rng 12/18/2021  Glucose Fasting, POC     70 - 99 mg/dL 686 !   Islet Cell Ab     Neg:<1:1  Negative   ZNT8 Antibodies     <15 U/mL <10   Glutamic Acid Decarb Ab     <5 IU/mL 226 (H)     Glucose     70 - 99 mg/dL 890 (H)   Glucose     65 - 99 mg/dL 896 (H)    C-Peptide     0.80 - 3.85 ng/mL 0.80    2.  Dyslipidemia  PLAN:  1. Patient with longstanding, uncontrolled, LADA, off insulin  in the past after repeatedly refusing to start it, now on basal insulin  with only occasional occasional rapid acting insulin .  At last visit he was on Humalog  but we discussed about trying to at least use it before larger meals.  He was off metformin  and Actos  due to perceived weight loss.  After stopping these, he felt well, but he did not gain weight.  We did not restart these.  HbA1c at last visit was better, at 6.6%.  Reviewed CGM trends, sugars are much better, improved especially overnight.  He still had occasional hyperglycemic peaks after larger meals but this was likely due to the fact that he was not taking any Humalog  before meals.  He was occasionally taking 3 units for correction, which was dropping his blood sugars too low after the initial postprandial peak.  I again advised him to try to take 2 units of Humalog  ideally before larger meals as he did not appear to be needing this before the rest of the meals. -At last visit, he mentioned a cough for the last 4 months and also some shortness of breath and chest pain related to this.  He tells me that he had his teeth pulled since last visit and all the symptoms resolved afterwards. CGM interpretation: -At today's visit, we reviewed his CGM downloads: It appears that 82% of values are in target range (goal >70%), while 17% are higher than 180 (goal <25%), and 1% are lower than 70 (goal <4%).  The calculated average blood sugar is 148.  The projected HbA1c for the next 3 months (GMI) is 6.9%. -Reviewing the CGM trends, sugars appear to be slightly worse than last visit, still mainly fluctuating within the target range but with gradual increase in blood sugars overnight.  He tells me that he is still drinking beers when coming home from work in the afternoon and this dropped his blood sugars to a nadir around 11 PM.  He  then eats dinner but he is not taking Humalog  for this most of the time because he mentions that this drops his sugars too low overnight.  In the light of the increased blood sugars overnight, I did recommend to take 1 to 2 units, which is not a large amount.  We continue to the rest of the regimen. -I also refilled his glucagon  pen at today's visit -I refilled his freestyle libre 2+ CGM per his preference, as he did not like the freestyle libre 3+ sensor.  I did advise him that I am not sure that the 2+ sensor is still available - I suggested to:  Patient Instructions  Please continue: - Januvia  100 mg  before breakfast - Tresiba  5 units daily  Take: - Humalog  1-2 units 15 min before dinner unless the sugars are <90, when you should inject at the start of the meal   Please return in 4-6 months.  - we checked his HbA1c: 7.1% (higher) - advised to check sugars at different times of the day - 4x a day, rotating check times - advised for yearly eye exams >> he is not UTD ($$) - return to clinic in 4-6 months  2.  Dyslipidemia - Latest lipid panel was reviewed from last visit: LDL above target, otherwise fractions at goal: Lab Results  Component Value Date   CHOL 250 (H) 03/04/2024   HDL 70 03/04/2024   LDLCALC 159 (H) 03/04/2024   TRIG 98 03/04/2024   CHOLHDL 3.6 03/04/2024  - He declined statins and ezetimibe  Lela Fendt, MD PhD [facility] Endocrinology

## 2024-09-09 NOTE — Addendum Note (Signed)
 Addended by: CLEOTILDE ROLIN RAMAN on: 09/09/2024 08:34 AM   Modules accepted: Orders

## 2025-01-04 ENCOUNTER — Ambulatory Visit: Admitting: Internal Medicine
